# Patient Record
Sex: Female | Born: 1940 | Race: White | Hispanic: No | Smoking: Never smoker
Health system: Southern US, Community
[De-identification: ages and names within clinical notes are randomized; demographics above are authoritative.]

## PROBLEM LIST (undated history)

## (undated) DIAGNOSIS — I10 Essential (primary) hypertension: Secondary | ICD-10-CM

## (undated) DIAGNOSIS — N9489 Other specified conditions associated with female genital organs and menstrual cycle: Secondary | ICD-10-CM

## (undated) DIAGNOSIS — N8189 Other female genital prolapse: Secondary | ICD-10-CM

## (undated) DIAGNOSIS — H9190 Unspecified hearing loss, unspecified ear: Secondary | ICD-10-CM

## (undated) DIAGNOSIS — M1990 Unspecified osteoarthritis, unspecified site: Secondary | ICD-10-CM

## (undated) DIAGNOSIS — E039 Hypothyroidism, unspecified: Secondary | ICD-10-CM

## (undated) DIAGNOSIS — R102 Pelvic and perineal pain: Secondary | ICD-10-CM

## (undated) HISTORY — DX: Unspecified osteoarthritis, unspecified site: M19.90

## (undated) HISTORY — DX: Hypothyroidism, unspecified: E03.9

## (undated) HISTORY — DX: Other specified conditions associated with female genital organs and menstrual cycle: N94.89

## (undated) HISTORY — PX: ORTHOPEDIC SURGERY: SHX850

## (undated) HISTORY — DX: Unspecified hearing loss, unspecified ear: H91.90

## (undated) HISTORY — DX: Essential (primary) hypertension: I10

## (undated) HISTORY — DX: Other female genital prolapse: N81.89

## (undated) HISTORY — DX: Pelvic and perineal pain: R10.2

## (undated) HISTORY — PX: LEEP: SHX91

---

## 2005-06-19 ENCOUNTER — Ambulatory Visit: Payer: Self-pay

## 2006-06-25 ENCOUNTER — Ambulatory Visit: Payer: Self-pay

## 2007-06-29 ENCOUNTER — Ambulatory Visit: Payer: Self-pay | Admitting: Internal Medicine

## 2008-07-11 ENCOUNTER — Ambulatory Visit: Payer: Self-pay | Admitting: Internal Medicine

## 2009-07-31 ENCOUNTER — Ambulatory Visit: Payer: Self-pay | Admitting: Internal Medicine

## 2010-08-05 ENCOUNTER — Ambulatory Visit: Payer: Self-pay

## 2011-09-11 LAB — HM MAMMOGRAPHY

## 2011-09-11 LAB — HM PAP SMEAR: HM Pap smear: NEGATIVE

## 2011-10-05 ENCOUNTER — Ambulatory Visit: Payer: Self-pay

## 2014-05-06 DIAGNOSIS — E039 Hypothyroidism, unspecified: Secondary | ICD-10-CM | POA: Diagnosis present

## 2014-05-06 DIAGNOSIS — I129 Hypertensive chronic kidney disease with stage 1 through stage 4 chronic kidney disease, or unspecified chronic kidney disease: Secondary | ICD-10-CM | POA: Diagnosis present

## 2014-05-06 DIAGNOSIS — M810 Age-related osteoporosis without current pathological fracture: Secondary | ICD-10-CM | POA: Diagnosis present

## 2017-10-06 ENCOUNTER — Encounter: Payer: Self-pay | Admitting: Obstetrics and Gynecology

## 2017-10-06 ENCOUNTER — Other Ambulatory Visit: Payer: Self-pay | Admitting: Obstetrics and Gynecology

## 2017-10-06 ENCOUNTER — Encounter: Payer: BLUE CROSS/BLUE SHIELD | Admitting: Obstetrics and Gynecology

## 2017-10-06 MED ORDER — TRIAMCINOLONE ACETONIDE 0.025 % EX OINT: 1.0000 "application " | TOPICAL_OINTMENT | Freq: Two times a day (BID) | CUTANEOUS | 0 refills | Status: DC

## 2017-10-06 NOTE — Progress Notes (Signed)
This encounter was created in error - please disregard.

## 2017-10-06 NOTE — Progress Notes (Signed)
Pt with mild rectal itching and treating with OTC hydrocortisone crm with temporary relief. Tissue is mildy hypopigmented. Question LS vs chronic irritation. Rx triamcinolone crm to use sparingly for up to 2 wks. F/u if no sx relief.

## 2018-08-05 ENCOUNTER — Other Ambulatory Visit: Payer: Self-pay | Admitting: Internal Medicine

## 2018-08-05 DIAGNOSIS — Z1231 Encounter for screening mammogram for malignant neoplasm of breast: Secondary | ICD-10-CM

## 2018-10-26 ENCOUNTER — Encounter (HOSPITAL_COMMUNITY): Payer: Self-pay

## 2018-10-26 ENCOUNTER — Ambulatory Visit
Admission: RE | Admit: 2018-10-26 | Discharge: 2018-10-26 | Disposition: A | Discharge status: Home / Self Care | Payer: Medicare Other | Source: Ambulatory Visit | Attending: Internal Medicine | Admitting: Internal Medicine

## 2018-10-26 DIAGNOSIS — Z1231 Encounter for screening mammogram for malignant neoplasm of breast: Secondary | ICD-10-CM | POA: Diagnosis present

## 2018-11-11 ENCOUNTER — Encounter: Payer: Self-pay | Admitting: Maternal Newborn

## 2018-11-11 ENCOUNTER — Ambulatory Visit (INDEPENDENT_AMBULATORY_CARE_PROVIDER_SITE_OTHER): Payer: Medicare Other | Admitting: Maternal Newborn

## 2018-11-11 VITALS — BP 144/60 | HR 87 | Ht 64.0 in | Wt 158.0 lb

## 2018-11-11 DIAGNOSIS — R309 Painful micturition, unspecified: Secondary | ICD-10-CM

## 2018-11-11 DIAGNOSIS — R102 Pelvic and perineal pain: Secondary | ICD-10-CM | POA: Diagnosis not present

## 2018-11-11 LAB — POCT URINALYSIS DIPSTICK
Bilirubin, UA: NEGATIVE
Blood, UA: NEGATIVE
Glucose, UA: NEGATIVE
Leukocytes, UA: NEGATIVE
Nitrite, UA: NEGATIVE
Protein, UA: POSITIVE — AB
Spec Grav, UA: 1.015 (ref 1.010–1.025)
Urobilinogen, UA: 0.2 E.U./dL
pH, UA: 6 (ref 5.0–8.0)

## 2018-11-11 MED ORDER — TRIAMCINOLONE ACETONIDE 0.025 % EX OINT: 1.0000 "application " | TOPICAL_OINTMENT | Freq: Two times a day (BID) | CUTANEOUS | 0 refills | Status: DC

## 2018-11-11 NOTE — Progress Notes (Signed)
Urine cu   Obstetrics & Gynecology Office Visit   Chief Complaint:  Chief Complaint  Patient presents with  . Urinary Tract Infection    painful urinating, frequent urination, and difficulty holding urine, no blood noticed x 3-4 days    History of Present Illness: Krystal Sanchez started noticing some burning in her perineal area after voiding about three or four days ago. She has urinary frequency and occasional urgency but does not think it is really increased from her baseline. She does not have pain during urination. She had a UTI in August. She was also prescribed some cream for a rash the last time she was here in the office and is wondering if this might help with her problem this time. She has not noticed any vaginal discharge. She denies vaginal and vulvar itching.  Review of Systems: Review of systems negative unless otherwise noted in HPI  Past Medical History:  Past Medical History:  Diagnosis Date  . Hard of hearing   . Hypertension   . Hypothyroidism   . Pelvic pressure in female   . Pelvic relaxation     Past Surgical History:  Past Surgical History:  Procedure Laterality Date  . LEEP    . ORTHOPEDIC SURGERY      Gynecologic History: No LMP recorded. Patient is postmenopausal.  Obstetric History: [id number]  Family History:  Family History  Problem Relation Age of Onset  . Parkinson's disease Father   . Breast cancer Other     Social History:  Social History   Socioeconomic History  . Marital status: Widowed    Spouse name: Not on file  . Number of children: Not on file  . Years of education: Not on file  . Highest education level: Not on file  Occupational History  . Not on file  Social Needs  . Financial resource strain: Not on file  . Food insecurity:    Worry: Not on file    Inability: Not on file  . Transportation needs:    Medical: Not on file    Non-medical: Not on file  Tobacco Use  . Smoking status: Never Smoker  . Smokeless tobacco: Never  Used  Substance and Sexual Activity  . Alcohol use: No    Frequency: Never  . Drug use: No  . Sexual activity: Not on file  Lifestyle  . Physical activity:    Days per week: Not on file    Minutes per session: Not on file  . Stress: Not on file  Relationships  . Social connections:    Talks on phone: Not on file    Gets together: Not on file    Attends religious service: Not on file    Active member of club or organization: Not on file    Attends meetings of clubs or organizations: Not on file    Relationship status: Not on file  . Intimate partner violence:    Fear of current or ex partner: Not on file    Emotionally abused: Not on file    Physically abused: Not on file    Forced sexual activity: Not on file  Other Topics Concern  . Not on file  Social History Narrative  . Not on file    Allergies:  No Known Allergies  Medications: Prior to Admission medications   Medication Sig Start Date End Date Taking? Authorizing Provider  aspirin EC 81 MG tablet Take by mouth.    [provider]  atorvastatin (LIPITOR) 40 MG  tablet Take 40 mg by mouth daily. 08/30/17   [provider]  levothyroxine (SYNTHROID, LEVOTHROID) 50 MCG tablet TAKE 2 TABS BY MOUTH ONCE DAILY ON EMPTY STOMACH WITH GLASS OF WATER 30-60 MINS BEFORE BREAKFAST. 09/30/17   [provider]  levothyroxine (SYNTHROID, LEVOTHROID) 50 MCG tablet Take by mouth. 09/30/17   [provider]  LORazepam (ATIVAN) 0.5 MG tablet TAKE 1 TABLET BY MOUTH EVERY 8 HOURS AS NEEDED FOR ANXIETY 06/08/16   [provider]  losartan-hydrochlorothiazide (HYZAAR) 100-12.5 MG tablet Take 1 tablet by mouth daily. 09/18/17   [provider]  losartan-hydrochlorothiazide Mauri Reading) 100-12.5 MG tablet Take by mouth. 06/18/17   [provider]  omeprazole (PRILOSEC) 20 MG capsule Take by mouth.    [provider]  triamcinolone (KENALOG) 0.025 % ointment Apply 1 application  topically 2 (two) times daily. Prn sx up to 2 wks 10/06/17   Copland, Alicia B, PA-C  Vitamin D, Ergocalciferol, (DRISDOL) 50000 units CAPS capsule TAKE 1 CAPSULE BY MOUTH ONCE A WEEK AS DIRECTED 09/18/17   [provider]  Vitamin D, Ergocalciferol, (DRISDOL) 50000 units CAPS capsule TAKE 1 CAPSULE BY MOUTH ONCE A WEEK AS DIRECTED 06/18/17   [provider]    Physical Exam Vitals:  Vitals:   11/11/18 1023  BP: (!) 144/60  Pulse: 87   No LMP recorded. Patient is postmenopausal.  General: NAD HEENT: normocephalic, anicteric Pulmonary: No increased work of breathing Back: No CVAT Neurologic: Grossly intact Psychiatric: mood appropriate, affect full  Assessment: 78 y.o. [id number] with possible UTI versus perineal irritation.  Plan: Problem List Items Addressed This Visit    None    Visit Diagnoses    Painful urination    -  Primary   Relevant Orders   POCT Urinalysis Dipstick (Completed)   Urine Culture (Completed)   Perineal pain       Relevant Medications   triamcinolone (KENALOG) 0.025 % ointment     1) UA negative, will send culture to rule out UTI.  2) Sent refill for prescription cream to see if it helps with her symptoms of pain in the perineal area after voiding.  Marcelyn Bruins, CNM 11/11/2018

## 2018-11-13 LAB — URINE CULTURE

## 2018-11-14 ENCOUNTER — Encounter: Payer: Self-pay | Admitting: Maternal Newborn

## 2019-12-19 ENCOUNTER — Other Ambulatory Visit: Payer: Self-pay | Admitting: Internal Medicine

## 2019-12-19 DIAGNOSIS — N189 Chronic kidney disease, unspecified: Secondary | ICD-10-CM

## 2019-12-22 ENCOUNTER — Ambulatory Visit
Admission: RE | Admit: 2019-12-22 | Discharge: 2019-12-22 | Disposition: A | Discharge status: Home / Self Care | Payer: Medicare Other | Source: Ambulatory Visit | Attending: Internal Medicine | Admitting: Internal Medicine

## 2019-12-22 ENCOUNTER — Other Ambulatory Visit: Payer: Self-pay

## 2019-12-22 DIAGNOSIS — N189 Chronic kidney disease, unspecified: Secondary | ICD-10-CM | POA: Insufficient documentation

## 2020-01-02 ENCOUNTER — Other Ambulatory Visit: Payer: Self-pay | Admitting: Internal Medicine

## 2020-01-02 DIAGNOSIS — R1900 Intra-abdominal and pelvic swelling, mass and lump, unspecified site: Secondary | ICD-10-CM

## 2020-01-04 IMAGING — MG DIGITAL SCREENING BILATERAL MAMMOGRAM WITH TOMO AND CAD
8 series · 8 of 24 positions shown · non-contrast
Comparison: Previous exam(s).

CLINICAL DATA: Screening.

EXAM:
DIGITAL SCREENING BILATERAL MAMMOGRAM WITH TOMO AND CAD

[L CC synth-2D]
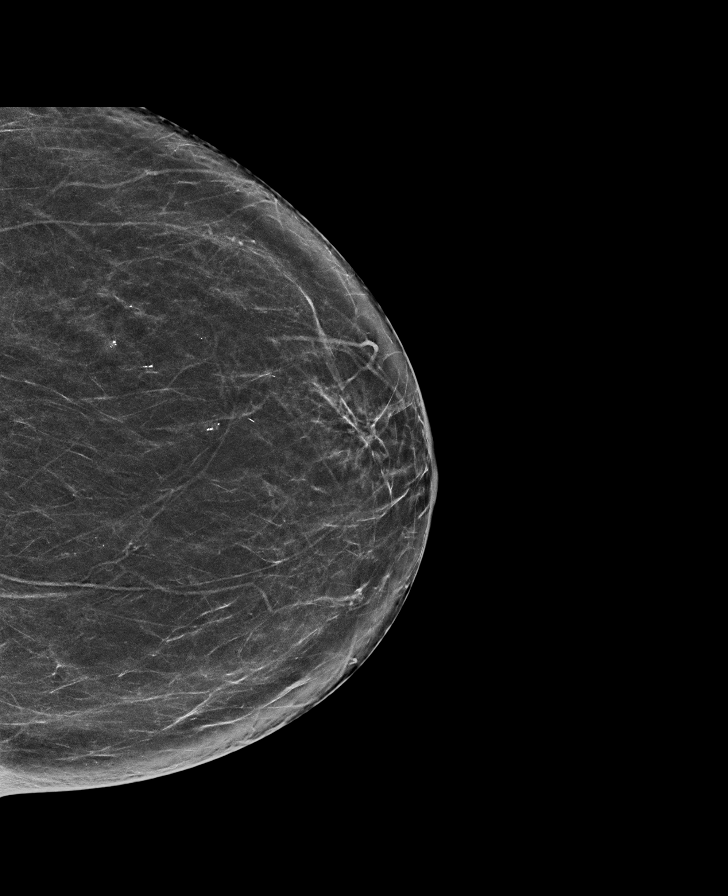

[L MLO synth-2D]
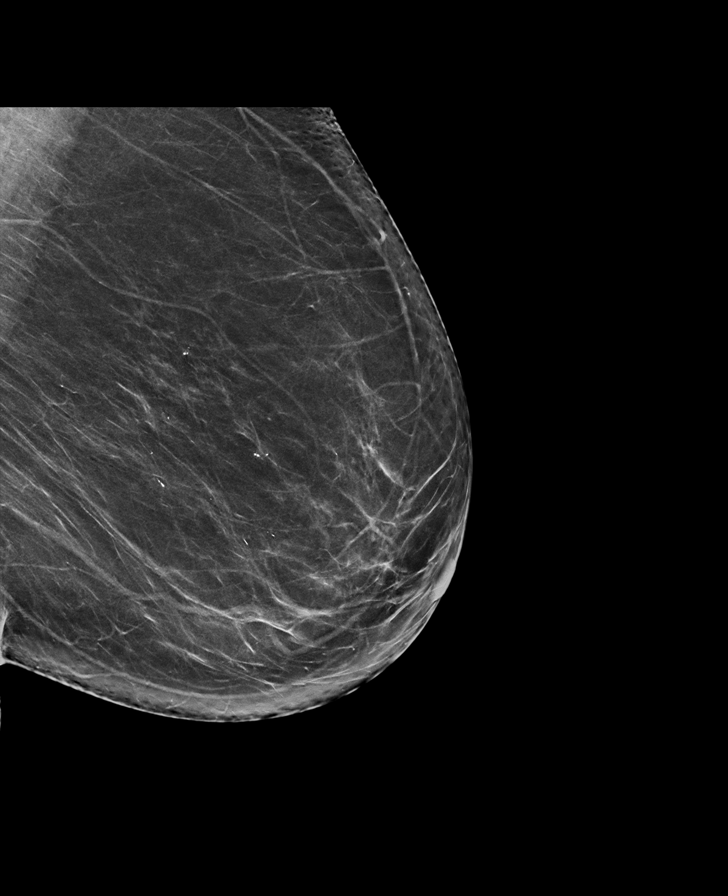

[R MLO synth-2D]
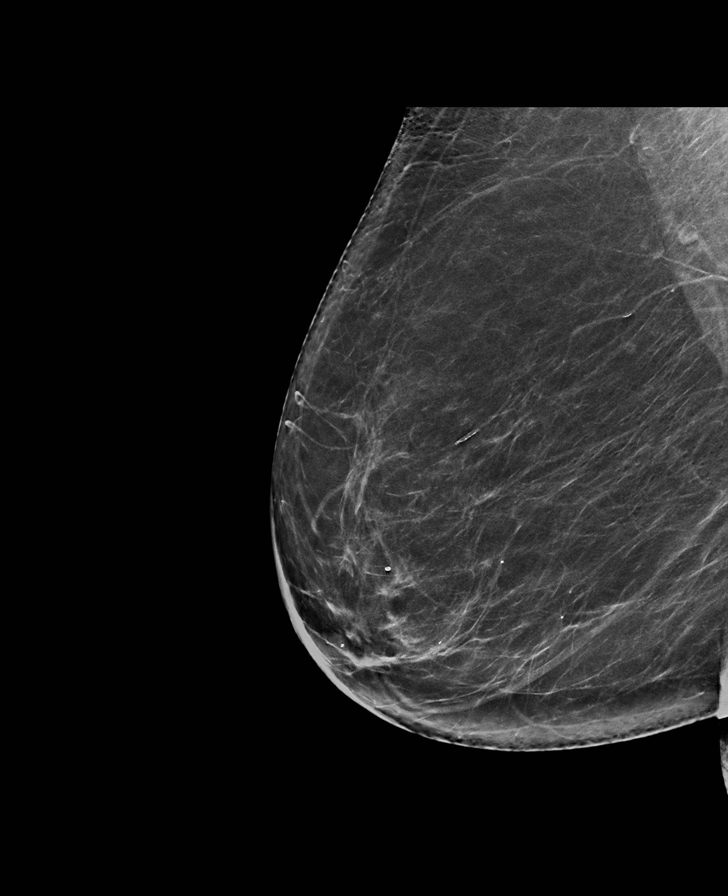

[R CC synth-2D]
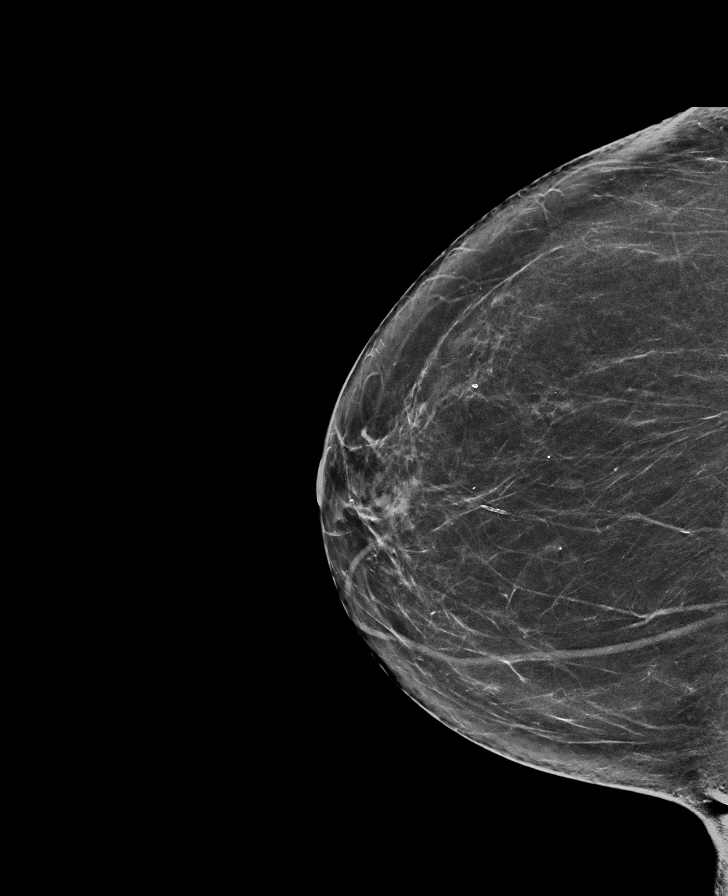

[L MLO tomo · tomo slice 39/78.0]
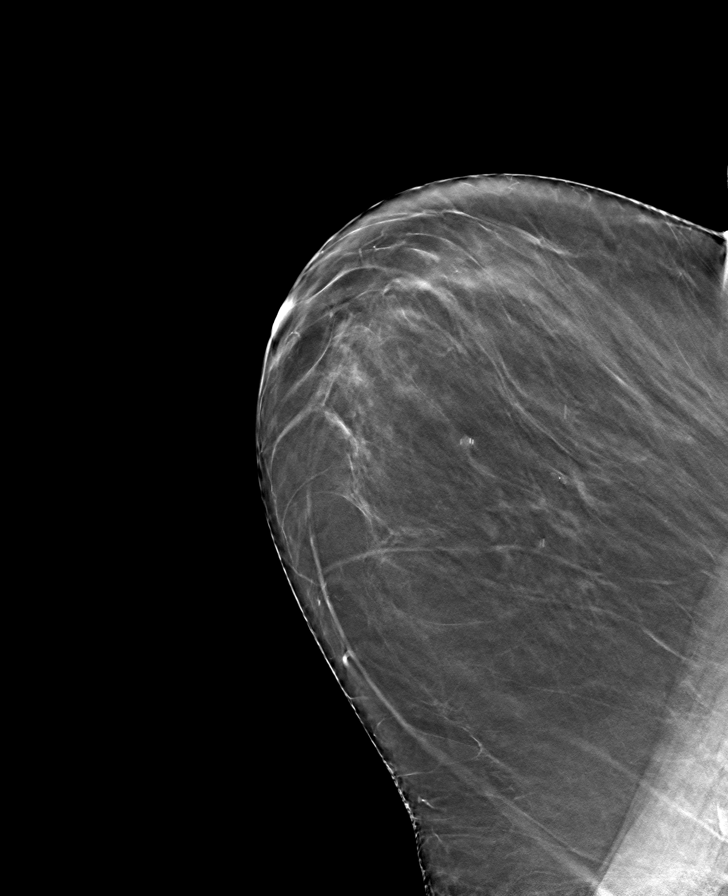

[R MLO tomo · tomo slice 40/79.0]
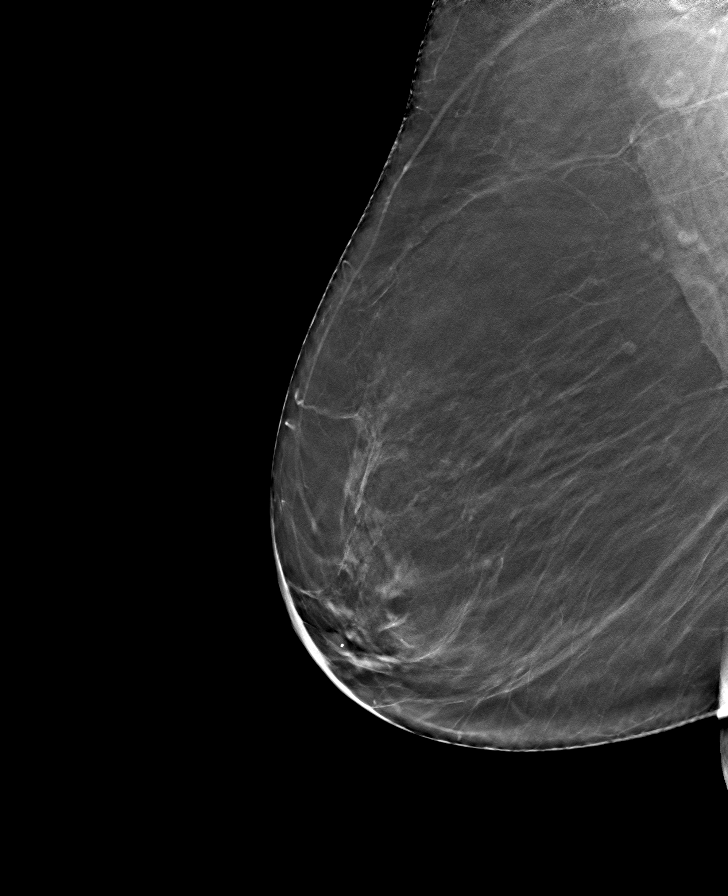

[R CC tomo · tomo slice 37/72.0]
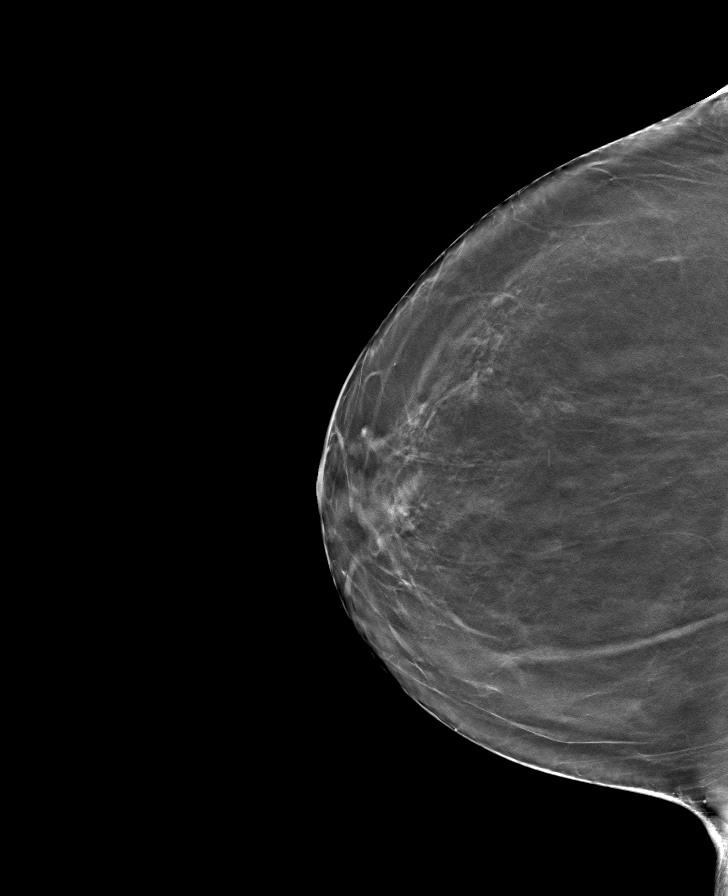

[L CC tomo · tomo slice 35/69.0]
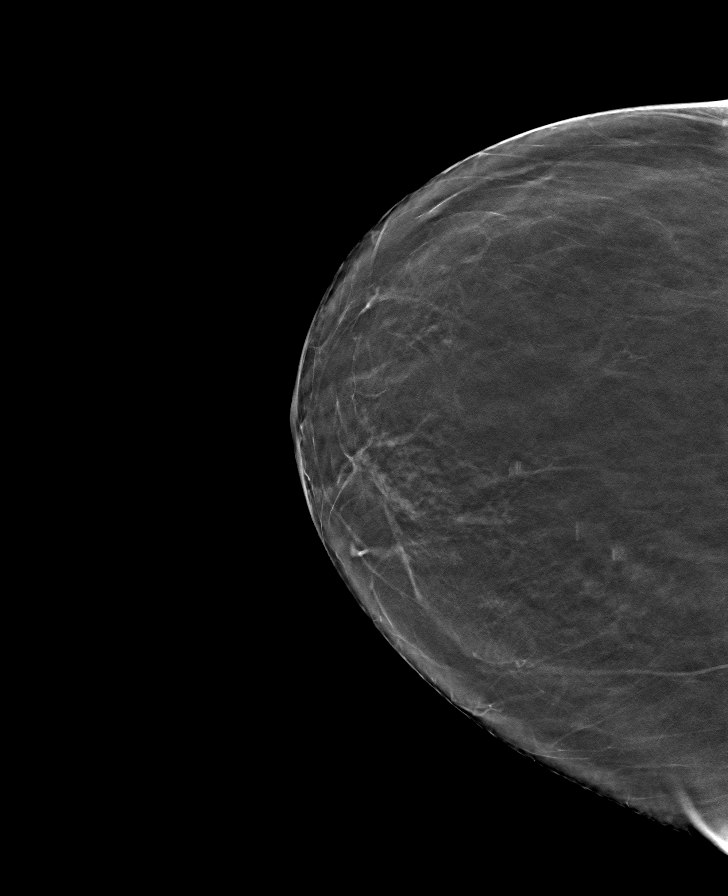

[8 of 24 positions shown; findings below may reference images not displayed]

ACR Breast Density Category b: There are scattered areas of
fibroglandular density.
FINDINGS: There are no findings suspicious for malignancy. Images were
processed with CAD.
IMPRESSION: No mammographic evidence of malignancy. A result letter of this
screening mammogram will be mailed directly to the patient.

RECOMMENDATION:
Screening mammogram in one year. (Code:[id number])

BI-RADS CATEGORY  1: Negative.

## 2020-01-05 ENCOUNTER — Ambulatory Visit
Admission: RE | Admit: 2020-01-05 | Discharge: 2020-01-05 | Disposition: A | Discharge status: Home / Self Care | Payer: Medicare Other | Source: Ambulatory Visit | Attending: Internal Medicine | Admitting: Internal Medicine

## 2020-01-05 ENCOUNTER — Other Ambulatory Visit: Payer: Self-pay

## 2020-01-05 DIAGNOSIS — R1900 Intra-abdominal and pelvic swelling, mass and lump, unspecified site: Secondary | ICD-10-CM | POA: Diagnosis present

## 2020-01-25 ENCOUNTER — Other Ambulatory Visit: Payer: Self-pay | Admitting: Internal Medicine

## 2020-01-25 DIAGNOSIS — R1900 Intra-abdominal and pelvic swelling, mass and lump, unspecified site: Secondary | ICD-10-CM

## 2020-02-01 ENCOUNTER — Other Ambulatory Visit: Payer: Self-pay

## 2020-02-01 DIAGNOSIS — N9489 Other specified conditions associated with female genital organs and menstrual cycle: Secondary | ICD-10-CM

## 2020-02-01 NOTE — Progress Notes (Signed)
Received inbox from Dr. Lucita Ferrara. Sonia Side to arrange consult for Ms. Santillo regarding adnexal mass. Spoke to Ms. Crain and appointment arranged for 4/21.

## 2020-02-07 ENCOUNTER — Encounter (INDEPENDENT_AMBULATORY_CARE_PROVIDER_SITE_OTHER): Payer: Self-pay

## 2020-02-07 ENCOUNTER — Inpatient Hospital Stay: Payer: Medicare Other | Attending: Obstetrics and Gynecology | Admitting: Obstetrics and Gynecology

## 2020-02-07 ENCOUNTER — Other Ambulatory Visit: Payer: Self-pay

## 2020-02-07 DIAGNOSIS — I129 Hypertensive chronic kidney disease with stage 1 through stage 4 chronic kidney disease, or unspecified chronic kidney disease: Secondary | ICD-10-CM | POA: Insufficient documentation

## 2020-02-07 DIAGNOSIS — E039 Hypothyroidism, unspecified: Secondary | ICD-10-CM | POA: Insufficient documentation

## 2020-02-07 DIAGNOSIS — H9190 Unspecified hearing loss, unspecified ear: Secondary | ICD-10-CM | POA: Insufficient documentation

## 2020-02-07 DIAGNOSIS — Z79899 Other long term (current) drug therapy: Secondary | ICD-10-CM | POA: Diagnosis not present

## 2020-02-07 DIAGNOSIS — R1900 Intra-abdominal and pelvic swelling, mass and lump, unspecified site: Secondary | ICD-10-CM | POA: Diagnosis present

## 2020-02-07 NOTE — Progress Notes (Signed)
Gynecologic Oncology Consult Visit   Referring Provider: Dr Leafy Ro  Chief Concern: pelvic mass  Subjective:  Krystal Sanchez is a 79 y.o. female who is seen in consultation from Dr. Leafy Ro for left ovarian 7.9 cm cystic mass.  Patient underwent an Renal US for her chronic kindey diease and a cystic mass in left adnexa was found; patient then underwent a pelvic ultrasound for further evaluation and was referred to [place] for consultation.  Renal US for CKD on 12/19/2019: There is an indeterminate 7.9 cm cystic appearing structure adjacent to the urinary bladder. This could represent a large bladder diverticulum or cystic ovarian mass. Follow-up with a contrast-enhanced CT or pelvic ultrasound is recommended for further evaluation of this finding.  TA/TVUS 01/05/2020: Ut: 6.1 x 2.5 x 4.2 cm = volume: 33 mL. Anteverted. Slightly heterogeneous myometrium. No focal mass. Small nabothian cysts at cervix. Endometrium Thickness: 2 mm. Small cyst within the endometrium at the upper uterine segment, 7 x 4 x 8 mm. Additional tiny endometrial cyst 59mm. No additional masses. RO: Not visualized, likely obscured by bowel LO: no normal appearing LEFT ovary visualized, see below Other findings No free pelvic fluid.  Cystic mass identified in the LEFT adnexa, 7.8 x 5.2 x 5.9 cm in overall size. Lesion contains a 2 mm thick septation and a few scattered internal echoes. No definite mural nodularity. No blood flow within the septation on color Doppler imaging. No mural nodularity. Impression: Complicated cystic lesion of the LEFT adnexa 7.8 cm in greatest size complicated by a single septation; since these may be difficult to assess completely with [place], further evaluation of simple-appearing and mildly complicated cysts >7 cm with MRI or surgical evaluation is recommended, according to the Society of Radiologists in Ultrasound 2010 Consensus Conference Statement (D Clovis Riley et al. Management of Asymptomatic Ovarian and  other Adnexal Cysts Imaged at [place]: Society of Radiologists in [place] Statement 2010. Radiology 256 (Sept 2010): 347-425.).   01/17/20 CA125=6 and HE=96 (normal)   She is hard of hearing and is accompanied by her daughter.  The patient lives alone with daughter 3 miles away.  Patient denies any pain or other symptoms that could be related to the ovarian cyst.  Problem List: Patient Active Problem List   Diagnosis Date Noted  . Pelvic mass in female 02/07/2020    Past Medical History: Past Medical History:  Diagnosis Date  . Hard of hearing   . Hypertension   . Hypothyroidism   . Pelvic pressure in female   . Pelvic relaxation     Past Surgical History: Past Surgical History:  Procedure Laterality Date  . LEEP    . ORTHOPEDIC SURGERY      OB History:  OB History  Gravida Para Term Preterm AB Living  2 2 2     2   SAB TAB Ectopic Multiple Live Births               # Outcome Date GA Lbr Len/2nd Weight Sex Delivery Anes PTL Lv  2 Term           1 Term             Family History: Family History  Problem Relation Age of Onset  . Parkinson's disease Father   . Breast cancer Other     Social History: Social History   Socioeconomic History  . Marital status: Widowed    Spouse name: Not on file  . Number of children: Not on file  .  Years of education: Not on file  . Highest education level: Not on file  Occupational History  . Not on file  Tobacco Use  . Smoking status: Never Smoker  . Smokeless tobacco: Never Used  Substance and Sexual Activity  . Alcohol use: No  . Drug use: No  . Sexual activity: Not on file  Other Topics Concern  . Not on file  Social History Narrative  . Not on file   Social Determinants of Health   Financial Resource Strain:   . Difficulty of Paying Living Expenses:   Food Insecurity:   . Worried About Programme researcher, broadcasting/film/video in the Last Year:   . Barista in the Last Year:   Transportation Needs:   .  Freight forwarder (Medical):   Marland Kitchen Lack of Transportation (Non-Medical):   Physical Activity:   . Days of Exercise per Week:   . Minutes of Exercise per Session:   Stress:   . Feeling of Stress :   Social Connections:   . Frequency of Communication with Friends and Family:   . Frequency of Social Gatherings with Friends and Family:   . Attends Religious Services:   . Active Member of Clubs or Organizations:   . Attends Banker Meetings:   Marland Kitchen Marital Status:   Intimate Partner Violence:   . Fear of Current or Ex-Partner:   . Emotionally Abused:   Marland Kitchen Physically Abused:   . Sexually Abused:     Allergies: No Known Allergies  Current Medications: Current Outpatient Medications  Medication Sig Dispense Refill  . atorvastatin (LIPITOR) 40 MG tablet Take 40 mg by mouth daily.  3  . losartan-hydrochlorothiazide (HYZAAR) 100-12.5 MG tablet Take 1 tablet by mouth daily.  1  . omeprazole (PRILOSEC) 20 MG capsule Take by mouth.    . Vitamin D, Ergocalciferol, (DRISDOL) 50000 units CAPS capsule TAKE 1 CAPSULE BY MOUTH ONCE A WEEK AS DIRECTED  1  . Vitamin D, Ergocalciferol, (DRISDOL) 50000 units CAPS capsule TAKE 1 CAPSULE BY MOUTH ONCE A WEEK AS DIRECTED     No current facility-administered medications for this visit.    Review of Systems General: negative for, fevers, chills, fatigue, changes in sleep, changes in weight or appetite Skin: negative for changes in color, texture, moles or lesions Eyes: negative for, changes in vision, pain, diplopia HEENT: negative for, pain, discharge, tinnitus, vertigo, voice changes, sore throat, neck masses Pulmonary: negative for, dyspnea, orthopnea, productive cough Cardiac: negative for, palpitations, syncope, pain, discomfort, pressure Gastrointestinal: negative for, dysphagia, nausea, vomiting, jaundice, pain, constipation, diarrhea, hematemesis, hematochezia Genitourinary/Sexual: negative for, dysuria, discharge, hesitancy,  nocturia, retention, stones, infections, STD's, incontinence Musculoskeletal: negative for, pain, stiffness, swelling, range of motion limitation Hematology: negative for, easy bruising, bleeding Neurologic/Psych: negative for, headaches, seizures, paralysis, weakness, tremor, change in gait, change in sensation, mood swings, depression, anxiety, change in memory  Objective:  Physical Examination:  There were no vitals taken for this visit.   ECOG Performance Status: 0 - Asymptomatic  General appearance: alert, cooperative and appears stated age HEENT:PERRLA, sclera clear, anicteric, thyroid without masses and trachea midline Lymph node survey: non-palpable, axillary, inguinal, supraclavicular Cardiovascular: regular rate and rhythm, no murmurs or gallops Respiratory: normal air entry, lungs clear to auscultation and no rales, rhonchi or wheezing Abdomen: soft, non-tender, without masses or organomegaly, no hernias and well healed incision Back: inspection of back is normal Extremities: extremities normal, atraumatic, no cyanosis or edema Skin exam - normal coloration and  turgor, no rashes, no suspicious skin lesions noted. Neurological exam reveals alert, oriented, normal speech, no focal findings or movement disorder noted.  Pelvic: exam chaperoned by nurse;  Vulva: normal appearing vulva with no masses, tenderness or lesions; Vagina: normal vagina; Adnexa: normal adnexa in size, nontender and no masses; Uterus: uterus is normal size, shape, consistency and nontender; Cervix: anteverted; Rectal: confirms no masses.  Assessment:  JUSTISS GERBINO is a 79 y.o. female diagnosed with incidentally found 7.9 cm left ovarian cystic mass with one septation. No prior imaging for comparison. Asymptomatic and normal tumor markers.  Medical co-morbidities complicating care:hard of hearing.  Plan:   Problem List Items Addressed This Visit      Other   Pelvic mass in female     We discussed  options for management including surgery versus expectant management.  Since she is asymptomatic and tumor markers are normal we advise expectant management and they are in agreement.  MRI was recommended by Radiology because mass larger than 7 cm, but [place] characteristics are benign and this is scheduled.  Assuming this is normal she can follow up with Dr Dalbert Garnet in 3 months for follow up US to confirm stability.  The patient's diagnosis, an outline of the further diagnostic and laboratory studies which will be required, the recommendation, and alternatives were discussed.  All questions were answered to the patient's satisfaction.  A total of 40 minutes were spent with the patient/family today; 60 % was spent in education, counseling and coordination of care for cystic ovarian mass.    Krystal Lauth, MD  CC:  Lauro Regulus, MD [address] [facility] [place],  [place] [address] [phone]

## 2020-02-07 NOTE — Progress Notes (Signed)
Pt new pt today- has adnexal mass, pt has no symptoms- no bleeding, no pain. Sometimes at night she has frequency of urine- never incontinent.

## 2020-02-08 ENCOUNTER — Ambulatory Visit: Payer: Medicare Other

## 2020-02-08 ENCOUNTER — Encounter: Payer: Self-pay | Admitting: Obstetrics and Gynecology

## 2020-06-03 ENCOUNTER — Telehealth: Payer: Self-pay

## 2020-06-03 NOTE — Telephone Encounter (Signed)
Follow up on requested [place] and office visit with Dr. Dalbert Garnet that was recommended during her 01/2020 visit with Dr. Johnnette Litter. Message sent to her office regarding. Navigator signing off.

## 2020-09-03 ENCOUNTER — Other Ambulatory Visit: Payer: Self-pay | Admitting: Internal Medicine

## 2020-09-03 DIAGNOSIS — N83209 Unspecified ovarian cyst, unspecified side: Secondary | ICD-10-CM

## 2020-09-10 ENCOUNTER — Ambulatory Visit: Payer: Medicare Other

## 2020-09-16 ENCOUNTER — Other Ambulatory Visit: Payer: Self-pay

## 2020-09-16 ENCOUNTER — Ambulatory Visit
Admission: RE | Admit: 2020-09-16 | Discharge: 2020-09-16 | Disposition: A | Discharge status: Home / Self Care | Payer: Medicare Other | Source: Ambulatory Visit | Attending: Internal Medicine | Admitting: Internal Medicine

## 2020-09-16 DIAGNOSIS — N83209 Unspecified ovarian cyst, unspecified side: Secondary | ICD-10-CM | POA: Insufficient documentation

## 2021-03-01 IMAGING — US US RENAL
1 series · 14 of 25 positions shown · non-contrast
Comparison: None.

CLINICAL DATA: Chronic kidney disease

EXAM:
RENAL / URINARY TRACT ULTRASOUND COMPLETE

[Series 1: us renal · 0.23mm/px · 14 of 45 slices shown]
[im 1/45]
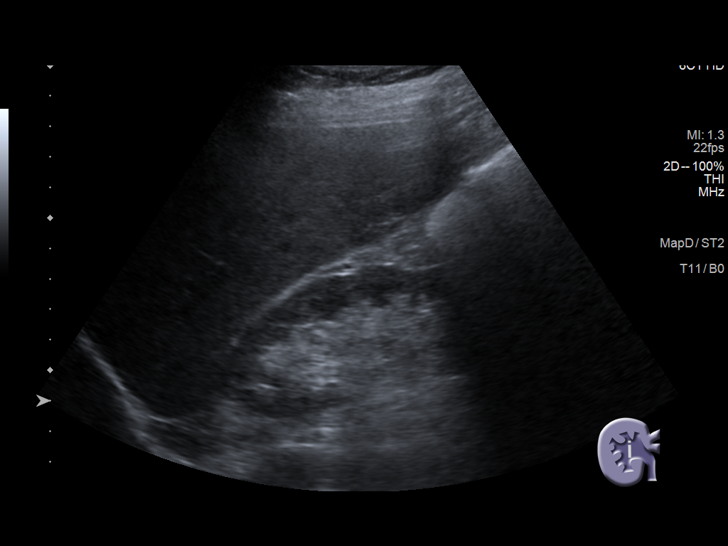
[im 4/45]
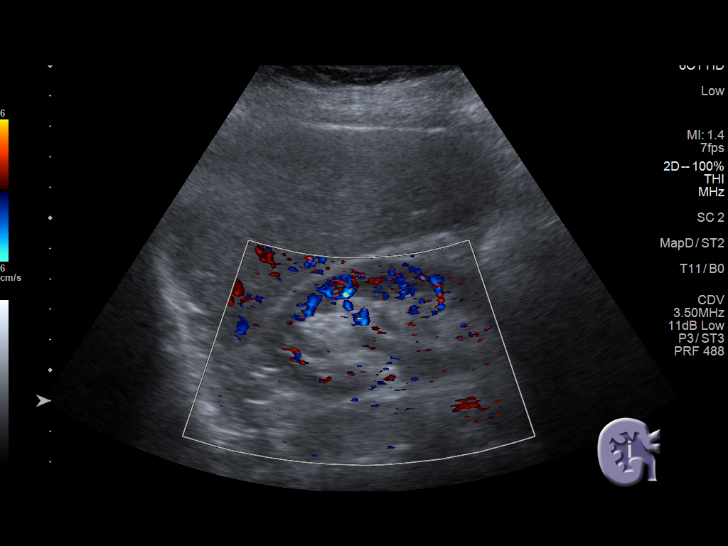
[im 8/45]
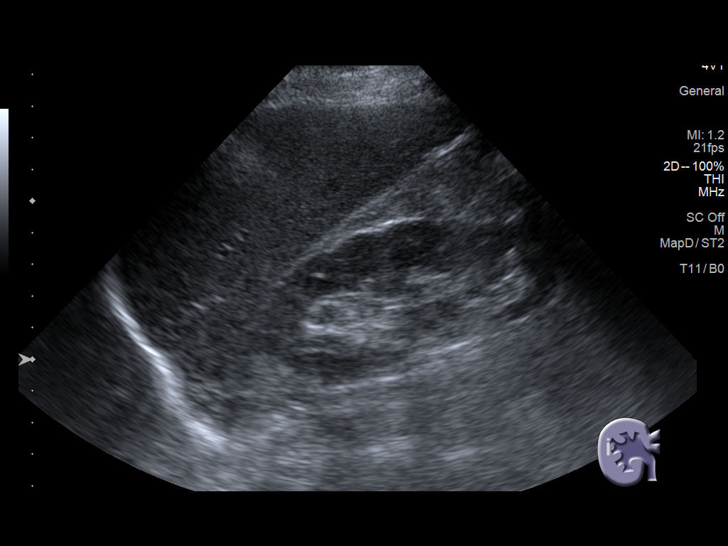
[im 12/45]
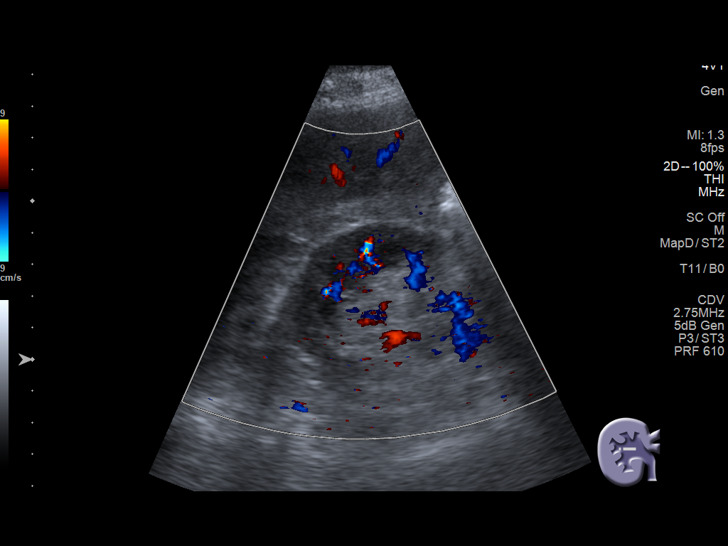
[im 15/45]
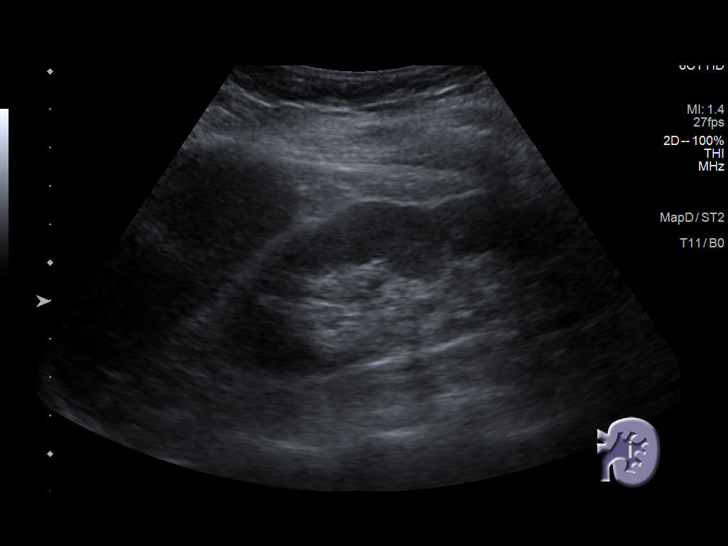
[im 17/45]
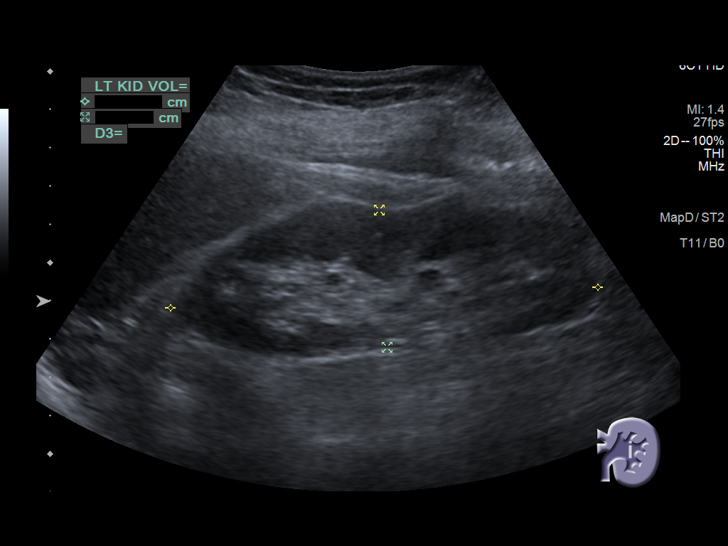
[im 21/45]
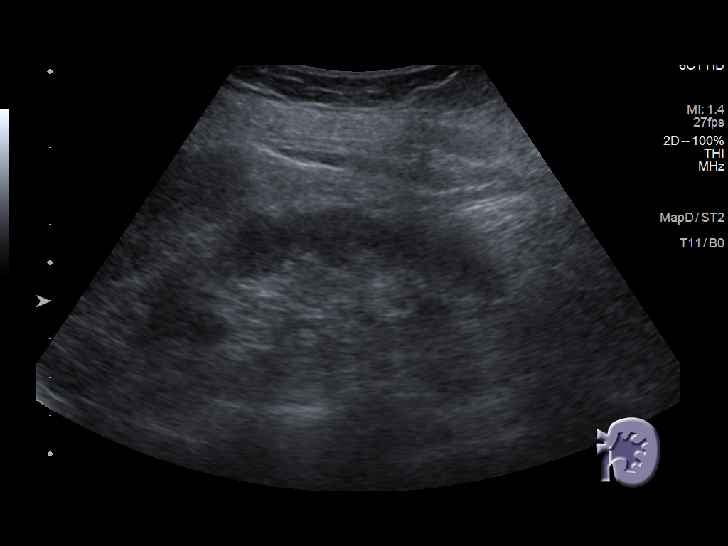
[im 24/45]
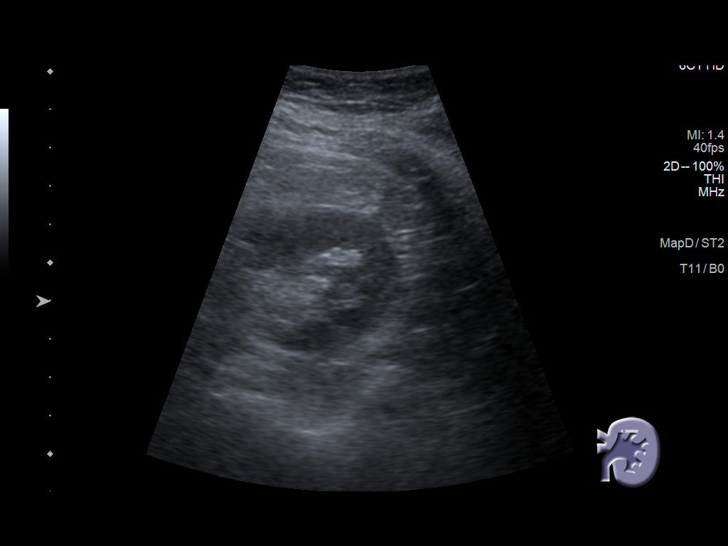
[im 28/45]
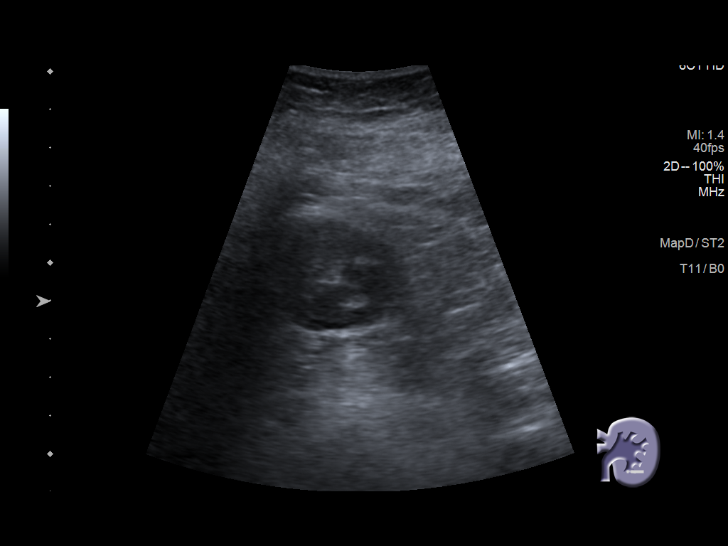
[im 30/45]
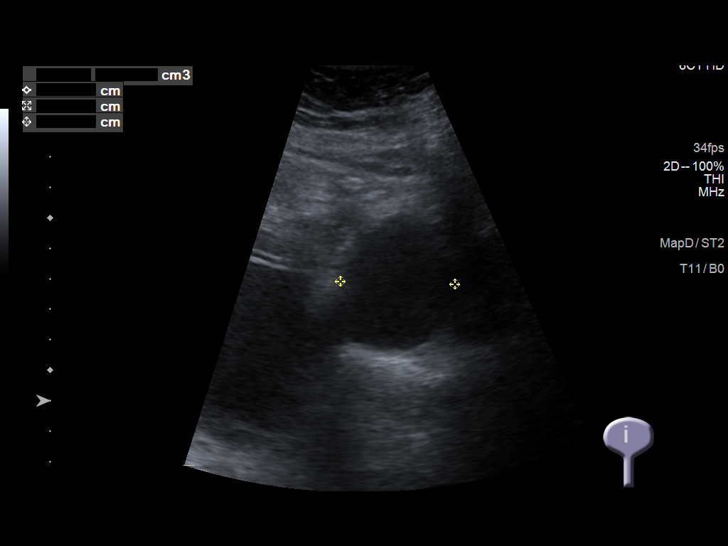
[im 34/45]
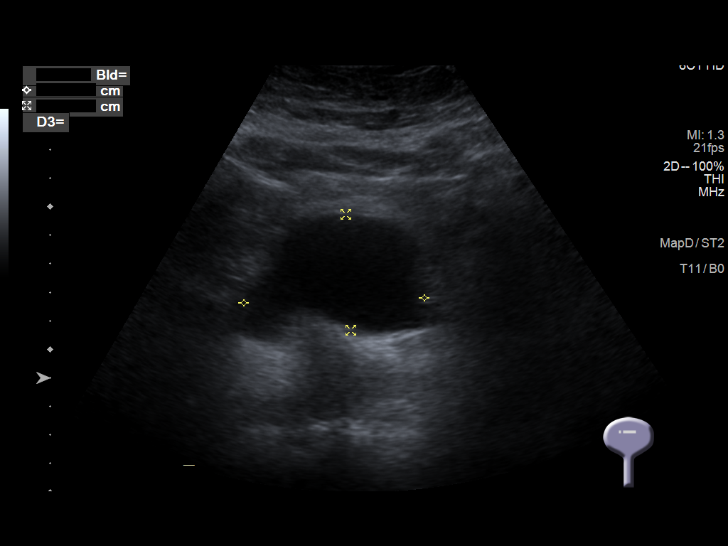
[im 37/45]
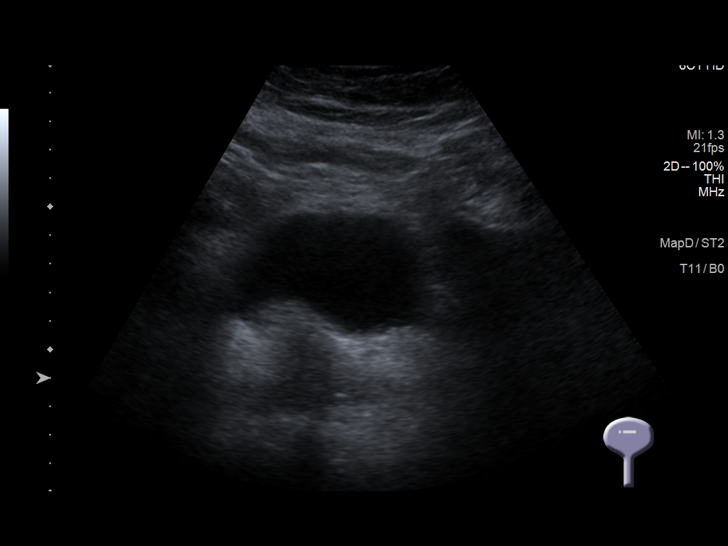
[im 41/45]
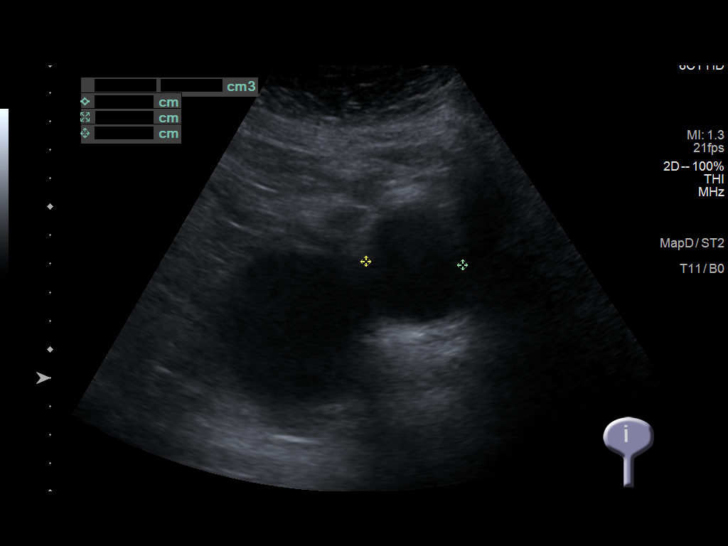
[im 45/45]
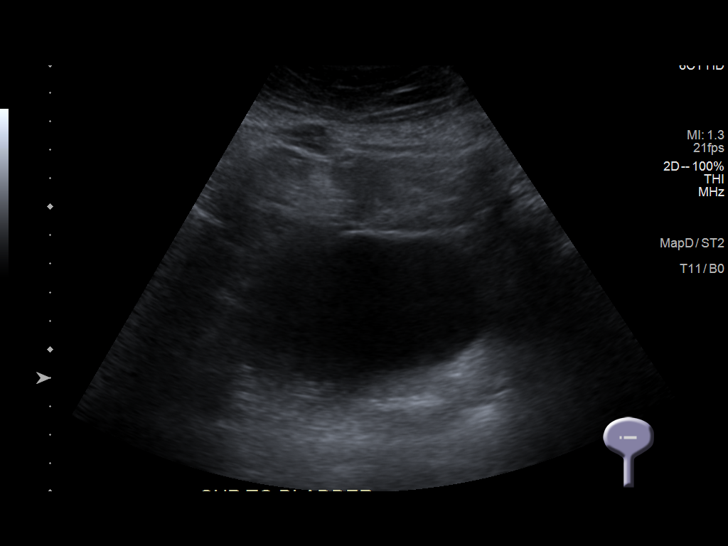

[14 of 25 positions shown; findings below may reference images not displayed]

FINDINGS: Right Kidney:

Renal measurements: 10 x 3.9 x 4.7 cm = volume: 95 mL . Echogenicity
within normal limits. No mass or hydronephrosis visualized.

Left Kidney:

Renal measurements: 11.2 x 3.6 x 4 cm = volume: 84 mL. Echogenicity
within normal limits. No mass or hydronephrosis visualized.

Bladder:

Appears normal for degree of bladder distention. Adjacent to the
bladder is a 5 x 7.9 x 4.8 cm structure.

Other:

None.
IMPRESSION: 1. No acute abnormality.  No evidence for hydronephrosis.
2. There is an indeterminate 7.9 cm cystic appearing structure
adjacent to the urinary bladder. This could represent a large
bladder diverticulum or cystic ovarian mass. Follow-up with a
contrast-enhanced CT or pelvic ultrasound is recommended for further
evaluation of this finding.

## 2021-11-25 IMAGING — US US PELVIS COMPLETE WITH TRANSVAGINAL
1 series · 13 of 25 positions shown · non-contrast
Comparison: 01/05/2020

CLINICAL DATA: Follow-up LEFT ovarian cyst; postmenopausal



[Series 1: us pelvic complete with transvaginal · 13 of 92 slices shown]
[im 1/92]
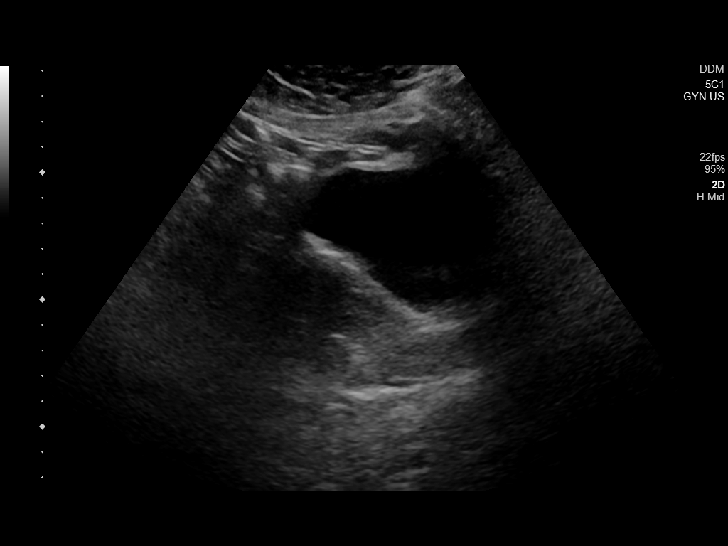
[im 8/92]
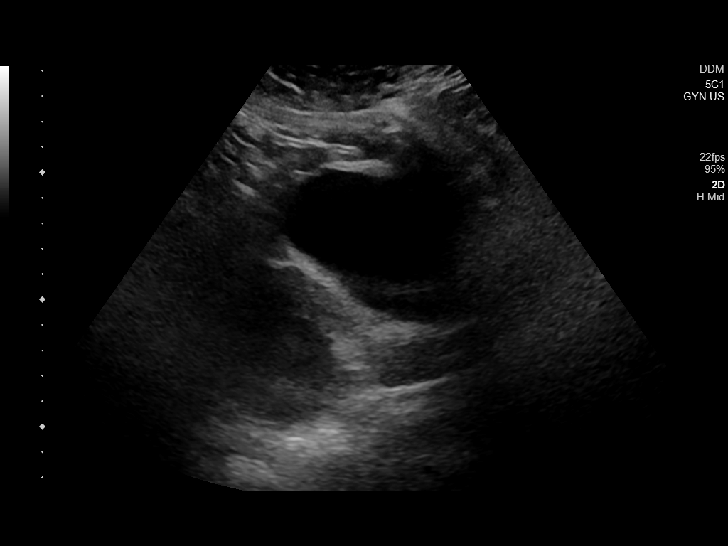
[im 16/92]
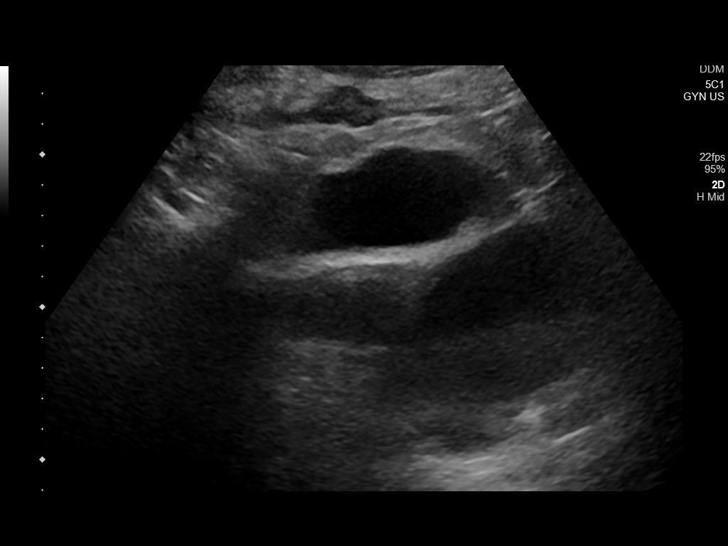
[im 23/92]
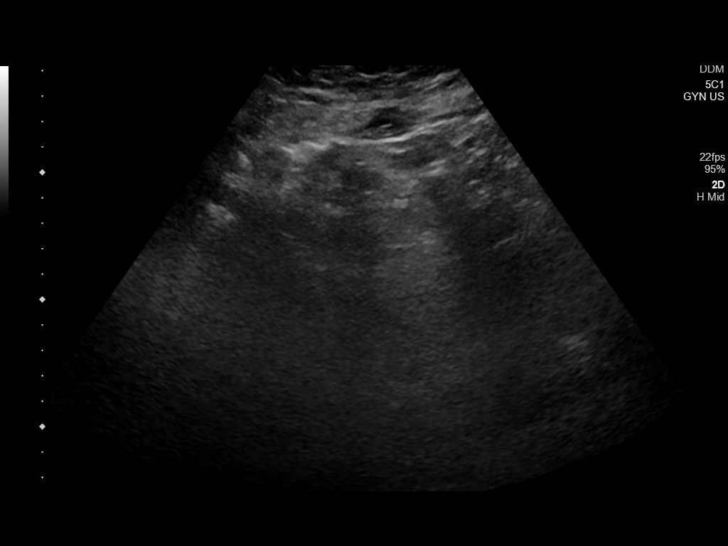
[im 31/92]
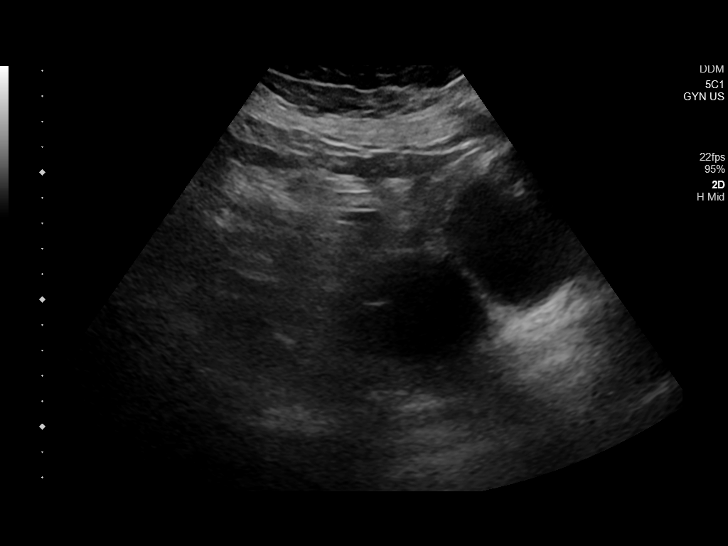
[im 38/92]
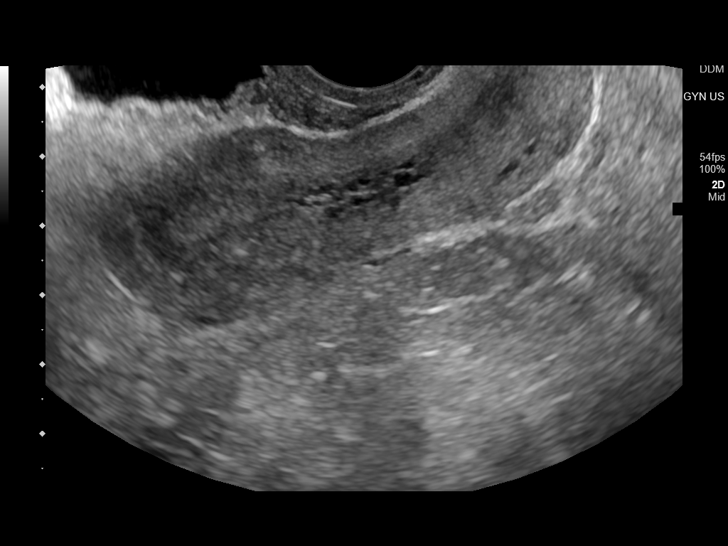
[im 46/92]
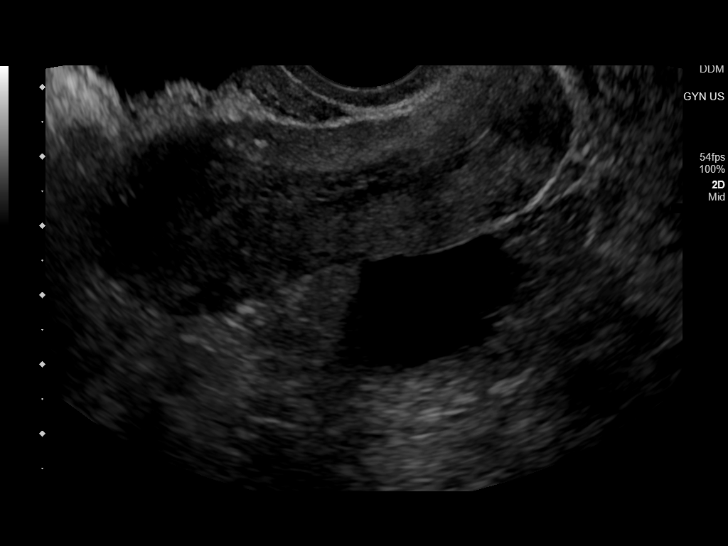
[im 54/92]
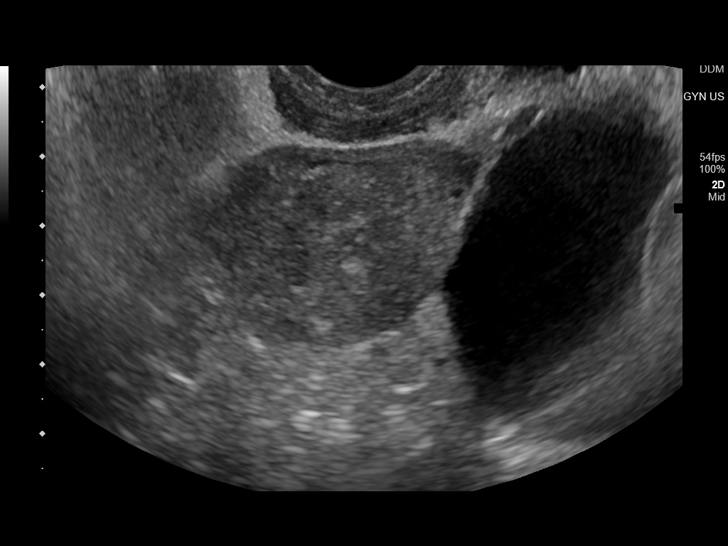
[im 61/92]
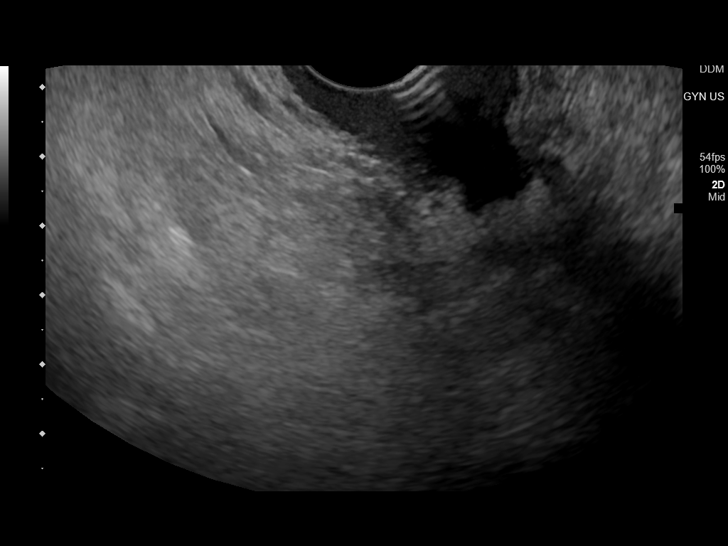
[im 69/92]
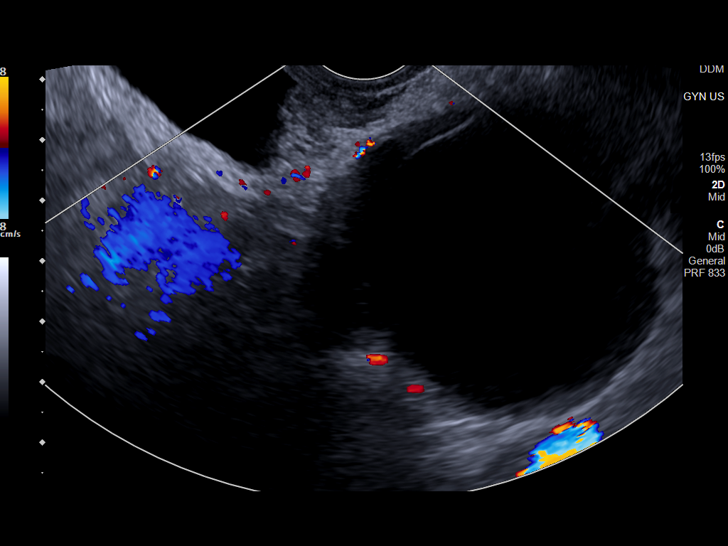
[im 76/92]
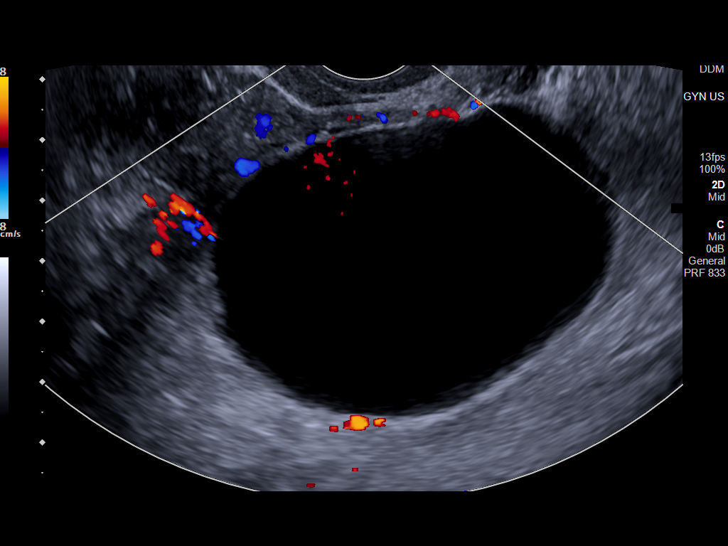
[im 84/92]
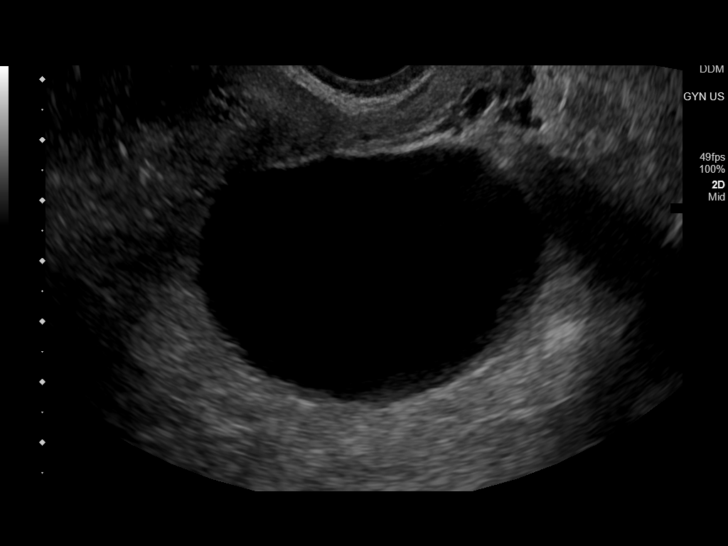
[im 92/92]
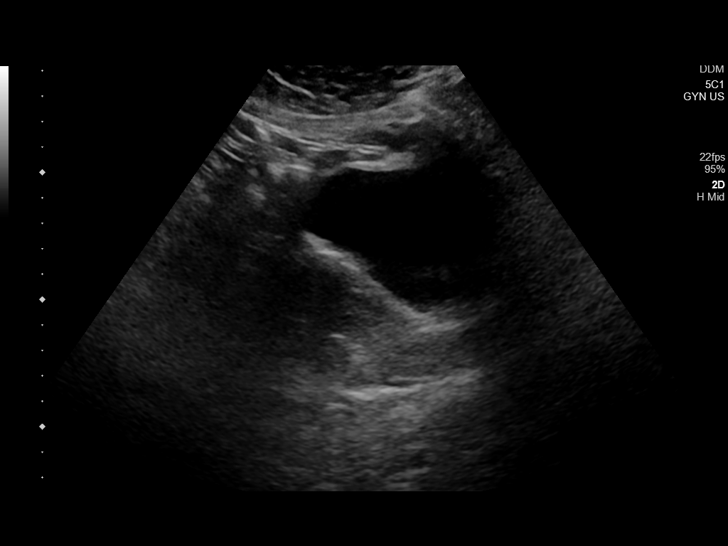

[13 of 25 positions shown; findings below may reference images not displayed]

FINDINGS: Uterus

Measurements: 7.6 x 2.6 x 3.9 cm = volume: 40 mL. Anteverted. Normal
morphology without mass

Endometrium

Thickness: 2 mm. Minimal cystic changes at the endometrium at the
lower uterine segment. No endometrial mass.

Right ovary

Not visualized, likely obscured by bowel

Left ovary

Measurements: 7.2 x 5.1 x 8.7 cm = volume: 168 mL. Large cystic mass
identified in LEFT adnexa likely of LEFT ovarian origin 7.5 x 4.8 x
7.1 cm (volume = 130 cm^3) previously 7.8 x 5.2 x 5.9 cm (volume
= 130 cm^3). Mass contains a single thin septation. No definite
mural nodularity.

Other findings

No free pelvic fluid.  No additional pelvic masses.
IMPRESSION: Persistent large complicated cyst of the LEFT ovary 7.5 cm in
greatest size containing a single thin septation, stable in volume
versus previous study.

Either further characterization by MR imaging or surgical evaluation
recommended.

This recommendation follows the consensus statement: Management of
Asymptomatic Ovarian and Other Adnexal Cysts Imaged at US: Society
of Radiologists in Ultrasound Consensus Conference Statement.

## 2022-02-24 ENCOUNTER — Encounter: Payer: Self-pay | Admitting: Ophthalmology

## 2022-02-26 NOTE — Discharge Instructions (Signed)

## 2022-03-03 ENCOUNTER — Other Ambulatory Visit: Payer: Self-pay

## 2022-03-03 ENCOUNTER — Ambulatory Visit: Payer: Medicare Other | Admitting: Anesthesiology

## 2022-03-03 ENCOUNTER — Ambulatory Visit
Admission: RE | Admit: 2022-03-03 | Discharge: 2022-03-03 | Disposition: A | Discharge status: Home / Self Care | Payer: Medicare Other | Attending: Ophthalmology | Admitting: Ophthalmology

## 2022-03-03 ENCOUNTER — Encounter
Admission: RE | Disposition: A | Discharge status: Home / Self Care | Payer: Self-pay | Source: Home / Self Care | Attending: Ophthalmology

## 2022-03-03 ENCOUNTER — Encounter: Payer: Self-pay | Admitting: Ophthalmology

## 2022-03-03 DIAGNOSIS — H2512 Age-related nuclear cataract, left eye: Secondary | ICD-10-CM | POA: Insufficient documentation

## 2022-03-03 DIAGNOSIS — Z79899 Other long term (current) drug therapy: Secondary | ICD-10-CM | POA: Diagnosis not present

## 2022-03-03 DIAGNOSIS — Z01810 Encounter for preprocedural cardiovascular examination: Secondary | ICD-10-CM | POA: Diagnosis not present

## 2022-03-03 DIAGNOSIS — Z79890 Hormone replacement therapy (postmenopausal): Secondary | ICD-10-CM | POA: Diagnosis not present

## 2022-03-03 DIAGNOSIS — I10 Essential (primary) hypertension: Secondary | ICD-10-CM | POA: Diagnosis not present

## 2022-03-03 DIAGNOSIS — E039 Hypothyroidism, unspecified: Secondary | ICD-10-CM | POA: Diagnosis not present

## 2022-03-03 DIAGNOSIS — M1990 Unspecified osteoarthritis, unspecified site: Secondary | ICD-10-CM | POA: Diagnosis not present

## 2022-03-03 HISTORY — PX: CATARACT EXTRACTION W/PHACO: SHX586

## 2022-03-03 SURGERY — PHACOEMULSIFICATION, CATARACT, WITH IOL INSERTION
Anesthesia: Monitor Anesthesia Care | Site: Eye | Laterality: Left

## 2022-03-03 MED ORDER — LIDOCAINE HCL (PF) 2 % IJ SOLN
INTRAMUSCULAR | Status: DC | PRN
  Administered 2022-03-03: 1 mL via INTRAMUSCULAR

## 2022-03-03 MED ORDER — FENTANYL CITRATE (PF) 100 MCG/2ML IJ SOLN
INTRAMUSCULAR | Status: DC | PRN
  Administered 2022-03-03: 25 ug via INTRAVENOUS

## 2022-03-03 MED ORDER — TETRACAINE HCL 0.5 % OP SOLN
1.0000 [drp] | OPHTHALMIC | Status: DC | PRN
  Administered 2022-03-03 (×3): 1 [drp] via OPHTHALMIC

## 2022-03-03 MED ORDER — EPINEPHRINE PF 1 MG/ML IJ SOLN
INTRAMUSCULAR | Status: DC | PRN
  Administered 2022-03-03: 75 mL via OPHTHALMIC

## 2022-03-03 MED ORDER — ACETAMINOPHEN 160 MG/5ML PO SOLN: 325.0000 mg | ORAL | Status: DC | PRN

## 2022-03-03 MED ORDER — PHENYLEPHRINE HCL 10 % OP SOLN
1.0000 [drp] | OPHTHALMIC | Status: AC | PRN
Start: 2022-03-03 — End: 2022-03-03
  Administered 2022-03-03 (×3): 1 [drp] via OPHTHALMIC

## 2022-03-03 MED ORDER — BRIMONIDINE TARTRATE-TIMOLOL 0.2-0.5 % OP SOLN
OPHTHALMIC | Status: DC | PRN
  Administered 2022-03-03: 1 [drp] via OPHTHALMIC

## 2022-03-03 MED ORDER — MOXIFLOXACIN HCL 0.5 % OP SOLN
OPHTHALMIC | Status: DC | PRN
  Administered 2022-03-03: 0.2 mL via OPHTHALMIC

## 2022-03-03 MED ORDER — ONDANSETRON HCL 4 MG/2ML IJ SOLN: 4.0000 mg | Freq: Once | INTRAMUSCULAR | Status: DC | PRN

## 2022-03-03 MED ORDER — MIDAZOLAM HCL 2 MG/2ML IJ SOLN
INTRAMUSCULAR | Status: DC | PRN
  Administered 2022-03-03 (×2): .5 mg via INTRAVENOUS

## 2022-03-03 MED ORDER — SIGHTPATH DOSE#1 NA CHONDROIT SULF-NA HYALURON 40-17 MG/ML IO SOLN
INTRAOCULAR | Status: DC | PRN
Start: 2022-03-03 — End: 2022-03-03
  Administered 2022-03-03: 1 mL via INTRAOCULAR

## 2022-03-03 MED ORDER — SIGHTPATH DOSE#1 BSS IO SOLN
INTRAOCULAR | Status: DC | PRN
  Administered 2022-03-03: 15 mL

## 2022-03-03 MED ORDER — CYCLOPENTOLATE HCL 2 % OP SOLN
1.0000 [drp] | OPHTHALMIC | Status: AC | PRN
  Administered 2022-03-03 (×3): 1 [drp] via OPHTHALMIC

## 2022-03-03 MED ORDER — ACETAMINOPHEN 325 MG PO TABS: 325.0000 mg | ORAL_TABLET | ORAL | Status: DC | PRN

## 2022-03-03 SURGICAL SUPPLY — 10 items
CATARACT SUITE SIGHTPATH (MISCELLANEOUS) ×2 IMPLANT
FEE CATARACT SUITE SIGHTPATH (MISCELLANEOUS) ×1 IMPLANT
GLOVE SURG ENC TEXT LTX SZ8 (GLOVE) ×2 IMPLANT
GLOVE SURG TRIUMPH 8.0 PF LTX (GLOVE) ×2 IMPLANT
LENS IOL TECNIS EYHANCE 23.5 (Intraocular Lens) ×1 IMPLANT
NDL FILTER BLUNT 18X1 1/2 (NEEDLE) ×1 IMPLANT
NEEDLE FILTER BLUNT 18X 1/2SAF (NEEDLE) ×1
NEEDLE FILTER BLUNT 18X1 1/2 (NEEDLE) ×1 IMPLANT
SYR 3ML LL SCALE MARK (SYRINGE) ×2 IMPLANT
WATER STERILE IRR 250ML POUR (IV SOLUTION) ×2 IMPLANT

## 2022-03-03 NOTE — Op Note (Signed)
PREOPERATIVE DIAGNOSIS:  Nuclear sclerotic cataract of the left eye. ?  ?POSTOPERATIVE DIAGNOSIS:  Nuclear sclerotic cataract of the left eye. ?  ?OPERATIVE PROCEDURE:ORPROCALL@ ?  ?SURGEON:  Galen Manila, MD. ?  ?ANESTHESIA: ? ?Anesthesiologist: Jola Babinski, MD ?CRNA: Michaele Offer, CRNA ? ?1.      Managed anesthesia care. ?2.     0.75ml of Shugarcaine was instilled following the paracentesis ?  ?COMPLICATIONS:  None. ?  ?TECHNIQUE:   Stop and chop ?  ?DESCRIPTION OF PROCEDURE:  The patient was examined and consented in the preoperative holding area where the aforementioned topical anesthesia was applied to the left eye and then brought back to the Operating Room where the left eye was prepped and draped in the usual sterile ophthalmic fashion and a lid speculum was placed. A paracentesis was created with the side port blade and the anterior chamber was filled with viscoelastic. A near clear corneal incision was performed with the steel keratome. A continuous curvilinear capsulorrhexis was performed with a cystotome followed by the capsulorrhexis forceps. Hydrodissection and hydrodelineation were carried out with BSS on a blunt cannula. The lens was removed in a stop and chop  technique and the remaining cortical material was removed with the irrigation-aspiration handpiece. The capsular bag was inflated with viscoelastic and the Technis [id number] lens was placed in the capsular bag without complication. The remaining viscoelastic was removed from the eye with the irrigation-aspiration handpiece. The wounds were hydrated. The anterior chamber was flushed with BSS and the eye was inflated to physiologic pressure. 0.68ml Vigamox was placed in the anterior chamber. The wounds were found to be water tight. The eye was dressed with Combigan. The patient was given protective glasses to wear throughout the day and a shield with which to sleep tonight. The patient was also given drops with which to begin a drop regimen  today and will follow-up with me in one day. ?Implant Name Type Inv. Item Serial No. Manufacturer Lot No. LRB No. Used Action  ?LENS IOL TECNIS EYHANCE 23.5 - [id number] Intraocular Lens LENS IOL TECNIS EYHANCE 23.5 [id number] [employer]  Left 1 Implanted  ?  ?Procedure(s) with comments: ?CATARACT EXTRACTION PHACO AND INTRAOCULAR LENS PLACEMENT (IOC) LEFT (Left) - 19.32 ?01:33.0 ? ?Electronically signed: Galen Manila 03/03/2022 11:37 AM ? ?

## 2022-03-03 NOTE — Anesthesia Preprocedure Evaluation (Signed)
Anesthesia Evaluation  ?Patient identified by MRN, date of birth, ID band ?Patient awake ? ? ? ?Reviewed: ?Allergy & Precautions, NPO status  ? ?Airway ?Mallampati: II ? ?TM Distance: >3 FB ? ? ? ? Dental ?  ?Pulmonary ? ?  ?Pulmonary exam normal ? ? ? ? ? ? ? Cardiovascular ?hypertension,  ?Rhythm:Regular Rate:Normal ? ?LBBB ?  ?Neuro/Psych ?  ? GI/Hepatic ?  ?Endo/Other  ?Hypothyroidism  ? Renal/GU ?  ? ?  ?Musculoskeletal ? ?(+) Arthritis ,  ? Abdominal ?  ?Peds ? Hematology ?  ?Anesthesia Other Findings ? ? Reproductive/Obstetrics ? ?  ? ? ? ? ? ? ? ? ? ? ? ? ? ?  ?  ? ? ? ? ? ? ? ? ?Anesthesia Physical ?Anesthesia Plan ? ?ASA: 2 ? ?Anesthesia Plan: MAC  ? ?Post-op Pain Management: Minimal or no pain anticipated  ? ?Induction: Intravenous ? ?PONV Risk Score and Plan: TIVA, Midazolam and Treatment may vary due to age or medical condition ? ?Airway Management Planned: Natural Airway and Nasal Cannula ? ?Additional Equipment:  ? ?Intra-op Plan:  ? ?Post-operative Plan:  ? ?Informed Consent: I have reviewed the patients History and Physical, chart, labs and discussed the procedure including the risks, benefits and alternatives for the proposed anesthesia with the patient or authorized representative who has indicated his/her understanding and acceptance.  ? ? ? ? ? ?Plan Discussed with: CRNA ? ?Anesthesia Plan Comments:   ? ? ? ? ? ? ?Anesthesia Quick Evaluation ? ?

## 2022-03-03 NOTE — H&P (Signed)
[facility]  ? ?Primary Care Physician:  Lauro Regulus, MD ?Ophthalmologist: Dr. Maren Reamer ? ?Pre-Procedure History & Physical: ?HPI:  Krystal Sanchez is a 81 y.o. female here for cataract surgery. ?  ?Past Medical History:  ?Diagnosis Date  ? Adnexal mass   ? Arthritis   ? Hard of hearing   ? Hypertension   ? Hypothyroidism   ? Pelvic pressure in female   ? Pelvic relaxation   ? ? ?Past Surgical History:  ?Procedure Laterality Date  ? LEEP    ? ORTHOPEDIC SURGERY    ? ? ?Prior to Admission medications   ?Medication Sig Start Date End Date Taking? Authorizing Provider  ?atorvastatin (LIPITOR) 40 MG tablet Take 40 mg by mouth daily. 08/30/17  Yes [provider]  ?levothyroxine (SYNTHROID) 100 MCG tablet Take 75 mcg by mouth daily before breakfast.   Yes [provider]  ?losartan-hydrochlorothiazide (HYZAAR) 100-12.5 MG tablet Take 1 tablet by mouth daily. 09/18/17  Yes [provider]  ?potassium chloride (KLOR-CON) 10 MEQ tablet Take 10 mEq by mouth daily.   Yes [provider]  ?Vitamin D, Ergocalciferol, (DRISDOL) 50000 units CAPS capsule TAKE 1 CAPSULE BY MOUTH ONCE A WEEK AS DIRECTED 09/18/17  Yes [provider]  ? ? ?Allergies as of 02/18/2022  ? (No Known Allergies)  ? ? ?Family History  ?Problem Relation Age of Onset  ? Parkinson's disease Father   ? Breast cancer Other   ? ? ?Social History  ? ?Socioeconomic History  ? Marital status: Widowed  ?  Spouse name: Not on file  ? Number of children: Not on file  ? Years of education: Not on file  ? Highest education level: Not on file  ?Occupational History  ? Not on file  ?Tobacco Use  ? Smoking status: Never  ? Smokeless tobacco: Never  ?Vaping Use  ? Vaping Use: Never used  ?Substance and Sexual Activity  ? Alcohol use: No  ? Drug use: No  ? Sexual activity: Not Currently  ?Other Topics Concern  ? Not on file  ?Social History Narrative  ? Not on file  ? ?Social Determinants of Health  ? ?Financial  Resource Strain: Not on file  ?Food Insecurity: Not on file  ?Transportation Needs: Not on file  ?Physical Activity: Not on file  ?Stress: Not on file  ?Social Connections: Not on file  ?Intimate Partner Violence: Not on file  ? ? ?Review of Systems: ?See HPI, otherwise negative ROS ? ?Physical Exam: ?BP (!) 159/78   Pulse 83   Temp (!) 97 ?F (36.1 ?C)   Ht 5\' 5"  (1.651 m)   Wt 59.9 kg   SpO2 100%   BMI 21.97 kg/m?  ?General:   Alert, cooperative in NAD ?Head:  Normocephalic and atraumatic. ?Respiratory:  Normal work of breathing. ?Cardiovascular:  RRR ? ?Impression/Plan: ?Krystal Sanchez is here for cataract surgery. ? ?Risks, benefits, limitations, and alternatives regarding cataract surgery have been reviewed with the patient.  Questions have been answered.  All parties agreeable. ? ? ?Judd Gaudier, MD  03/03/2022, 11:09 AM ? ? ?

## 2022-03-03 NOTE — Transfer of Care (Signed)
Immediate Anesthesia Transfer of Care Note ? ?Patient: Krystal Sanchez ? ?Procedure(s) Performed: CATARACT EXTRACTION PHACO AND INTRAOCULAR LENS PLACEMENT (IOC) LEFT (Left: Eye) ? ?Patient Location: PACU ? ?Anesthesia Type: MAC ? ?Level of Consciousness: awake, alert  and patient cooperative ? ?Airway and Oxygen Therapy: Patient Spontanous Breathing and Patient connected to supplemental oxygen ? ?Post-op Assessment: Post-op Vital signs reviewed, Patient's Cardiovascular Status Stable, Respiratory Function Stable, Patent Airway and No signs of Nausea or vomiting ? ?Post-op Vital Signs: Reviewed and stable ? ?Complications: No notable events documented. ? ?

## 2022-03-03 NOTE — Anesthesia Postprocedure Evaluation (Signed)
Anesthesia Post Note ? ?Patient: Krystal Sanchez ? ?Procedure(s) Performed: CATARACT EXTRACTION PHACO AND INTRAOCULAR LENS PLACEMENT (IOC) LEFT (Left: Eye) ? ? ?  ?Patient location during evaluation: PACU ?Anesthesia Type: MAC ?Level of consciousness: awake ?Pain management: pain level controlled ?Vital Signs Assessment: post-procedure vital signs reviewed and stable ?Respiratory status: respiratory function stable ?Cardiovascular status: stable ?Postop Assessment: no apparent nausea or vomiting ?Anesthetic complications: no ? ? ?No notable events documented. ? ?Veda Canning ? ? ? ? ? ?

## 2022-03-09 ENCOUNTER — Encounter: Payer: Self-pay | Admitting: Ophthalmology

## 2022-03-11 NOTE — Discharge Instructions (Signed)

## 2022-03-17 ENCOUNTER — Ambulatory Visit: Payer: Medicare Other | Admitting: Anesthesiology

## 2022-03-17 ENCOUNTER — Ambulatory Visit
Admission: RE | Admit: 2022-03-17 | Discharge: 2022-03-17 | Disposition: A | Discharge status: Home / Self Care | Payer: Medicare Other | Attending: Ophthalmology | Admitting: Ophthalmology

## 2022-03-17 ENCOUNTER — Encounter: Payer: Self-pay | Admitting: Ophthalmology

## 2022-03-17 ENCOUNTER — Other Ambulatory Visit: Payer: Self-pay

## 2022-03-17 ENCOUNTER — Encounter
Admission: RE | Disposition: A | Discharge status: Home / Self Care | Payer: Self-pay | Source: Home / Self Care | Attending: Ophthalmology

## 2022-03-17 DIAGNOSIS — M1990 Unspecified osteoarthritis, unspecified site: Secondary | ICD-10-CM | POA: Diagnosis not present

## 2022-03-17 DIAGNOSIS — I10 Essential (primary) hypertension: Secondary | ICD-10-CM | POA: Insufficient documentation

## 2022-03-17 DIAGNOSIS — H2511 Age-related nuclear cataract, right eye: Secondary | ICD-10-CM | POA: Diagnosis not present

## 2022-03-17 DIAGNOSIS — E039 Hypothyroidism, unspecified: Secondary | ICD-10-CM | POA: Insufficient documentation

## 2022-03-17 DIAGNOSIS — I447 Left bundle-branch block, unspecified: Secondary | ICD-10-CM | POA: Insufficient documentation

## 2022-03-17 HISTORY — PX: CATARACT EXTRACTION W/PHACO: SHX586

## 2022-03-17 SURGERY — PHACOEMULSIFICATION, CATARACT, WITH IOL INSERTION
Anesthesia: Monitor Anesthesia Care | Site: Eye | Laterality: Right

## 2022-03-17 MED ORDER — TETRACAINE HCL 0.5 % OP SOLN
1.0000 [drp] | OPHTHALMIC | Status: DC | PRN
  Administered 2022-03-17 (×3): 1 [drp] via OPHTHALMIC

## 2022-03-17 MED ORDER — ARMC OPHTHALMIC DILATING DROPS
1.0000 "application " | OPHTHALMIC | Status: DC | PRN
  Administered 2022-03-17 (×3): 1 via OPHTHALMIC

## 2022-03-17 MED ORDER — MIDAZOLAM HCL 2 MG/2ML IJ SOLN
INTRAMUSCULAR | Status: DC | PRN
  Administered 2022-03-17: 1.5 mg via INTRAVENOUS

## 2022-03-17 MED ORDER — FENTANYL CITRATE (PF) 100 MCG/2ML IJ SOLN
INTRAMUSCULAR | Status: DC | PRN
  Administered 2022-03-17: 50 ug via INTRAVENOUS

## 2022-03-17 MED ORDER — BRIMONIDINE TARTRATE-TIMOLOL 0.2-0.5 % OP SOLN
OPHTHALMIC | Status: DC | PRN
  Administered 2022-03-17: 1 [drp] via OPHTHALMIC

## 2022-03-17 MED ORDER — SIGHTPATH DOSE#1 BSS IO SOLN
INTRAOCULAR | Status: DC | PRN
  Administered 2022-03-17: 15 mL

## 2022-03-17 MED ORDER — SIGHTPATH DOSE#1 NA CHONDROIT SULF-NA HYALURON 40-17 MG/ML IO SOLN
INTRAOCULAR | Status: DC | PRN
  Administered 2022-03-17: 1 mL via INTRAOCULAR

## 2022-03-17 MED ORDER — MOXIFLOXACIN HCL 0.5 % OP SOLN
OPHTHALMIC | Status: DC | PRN
  Administered 2022-03-17: 0.2 mL via OPHTHALMIC

## 2022-03-17 MED ORDER — EPINEPHRINE PF 1 MG/ML IJ SOLN
INTRAMUSCULAR | Status: DC | PRN
  Administered 2022-03-17: 76 mL via OPHTHALMIC

## 2022-03-17 MED ORDER — SIGHTPATH DOSE#1 BSS IO SOLN
INTRAOCULAR | Status: DC | PRN
  Administered 2022-03-17: 1 mL

## 2022-03-17 SURGICAL SUPPLY — 16 items
CANNULA ANT/CHMB 27G (MISCELLANEOUS) IMPLANT
CANNULA ANT/CHMB 27GA (MISCELLANEOUS) IMPLANT
CATARACT SUITE SIGHTPATH (MISCELLANEOUS) ×2 IMPLANT
FEE CATARACT SUITE SIGHTPATH (MISCELLANEOUS) ×1 IMPLANT
GLOVE SURG ENC TEXT LTX SZ8 (GLOVE) ×2 IMPLANT
GLOVE SURG TRIUMPH 8.0 PF LTX (GLOVE) ×2 IMPLANT
LENS IOL TECNIS EYHANCE 24.0 (Intraocular Lens) ×1 IMPLANT
NDL FILTER BLUNT 18X1 1/2 (NEEDLE) ×1 IMPLANT
NEEDLE FILTER BLUNT 18X 1/2SAF (NEEDLE) ×1
NEEDLE FILTER BLUNT 18X1 1/2 (NEEDLE) ×1 IMPLANT
PACK VIT ANT 23G (MISCELLANEOUS) IMPLANT
RING MALYGIN (MISCELLANEOUS) IMPLANT
SUT ETHILON 10-0 CS-B-6CS-B-6 (SUTURE)
SUTURE EHLN 10-0 CS-B-6CS-B-6 (SUTURE) IMPLANT
SYR 3ML LL SCALE MARK (SYRINGE) ×2 IMPLANT
WATER STERILE IRR 250ML POUR (IV SOLUTION) ×2 IMPLANT

## 2022-03-17 NOTE — Op Note (Signed)
PREOPERATIVE DIAGNOSIS:  Nuclear sclerotic cataract of the right eye.   POSTOPERATIVE DIAGNOSIS:  Cataract   OPERATIVE PROCEDURE:ORPROCALL@   SURGEON:  Krystal Manila, MD.   ANESTHESIA:  Anesthesiologist: Jola Babinski, MD CRNA: Michaele Offer, CRNA  1.      Managed anesthesia care. 2.      0.82ml of Shugarcaine was instilled in the eye following the paracentesis.   COMPLICATIONS:  None.   TECHNIQUE:   Stop and chop   DESCRIPTION OF PROCEDURE:  The patient was examined and consented in the preoperative holding area where the aforementioned topical anesthesia was applied to the right eye and then brought back to the Operating Room where the right eye was prepped and draped in the usual sterile ophthalmic fashion and a lid speculum was placed. A paracentesis was created with the side port blade and the anterior chamber was filled with viscoelastic. A near clear corneal incision was performed with the steel keratome. A continuous curvilinear capsulorrhexis was performed with a cystotome followed by the capsulorrhexis forceps. Hydrodissection and hydrodelineation were carried out with BSS on a blunt cannula. The lens was removed in a stop and chop  technique and the remaining cortical material was removed with the irrigation-aspiration handpiece. The capsular bag was inflated with viscoelastic and the Technis [id number]  lens was placed in the capsular bag without complication. The remaining viscoelastic was removed from the eye with the irrigation-aspiration handpiece. The wounds were hydrated. The anterior chamber was flushed with BSS and the eye was inflated to physiologic pressure. 0.43ml of Vigamox was placed in the anterior chamber. The wounds were found to be water tight. The eye was dressed with Combigan. The patient was given protective glasses to wear throughout the day and a shield with which to sleep tonight. The patient was also given drops with which to begin a drop regimen today and will  follow-up with me in one day. Implant Name Type Inv. Item Serial No. Manufacturer Lot No. LRB No. Used Action  LENS IOL TECNIS EYHANCE 24.0 - [id number] Intraocular Lens LENS IOL TECNIS EYHANCE 24.0 [id number] [employer]  Right 1 Implanted   Procedure(s) with comments: CATARACT EXTRACTION PHACO AND INTRAOCULAR LENS PLACEMENT (IOC) RIGHT (Right) - 25.36 2:00.5  Electronically signed: Galen Sanchez 03/17/2022 11:29 AM

## 2022-03-17 NOTE — Anesthesia Postprocedure Evaluation (Signed)
Anesthesia Post Note  Patient: Krystal Sanchez  Procedure(s) Performed: CATARACT EXTRACTION PHACO AND INTRAOCULAR LENS PLACEMENT (IOC) RIGHT (Right: Eye)     Patient location during evaluation: PACU Anesthesia Type: MAC Level of consciousness: awake Pain management: pain level controlled Vital Signs Assessment: post-procedure vital signs reviewed and stable Respiratory status: respiratory function stable Cardiovascular status: stable Postop Assessment: no apparent nausea or vomiting Anesthetic complications: no   No notable events documented.  Veda Canning

## 2022-03-17 NOTE — Anesthesia Preprocedure Evaluation (Signed)
Anesthesia Evaluation  Patient identified by MRN, date of birth, ID band Patient awake    Reviewed: Allergy & Precautions, NPO status   Airway Mallampati: II  TM Distance: >3 FB     Dental   Pulmonary    Pulmonary exam normal        Cardiovascular hypertension,  Rhythm:Regular Rate:Normal  LBBB   Neuro/Psych    GI/Hepatic   Endo/Other  Hypothyroidism   Renal/GU      Musculoskeletal  (+) Arthritis ,   Abdominal   Peds  Hematology   Anesthesia Other Findings   Reproductive/Obstetrics                             Anesthesia Physical  Anesthesia Plan  ASA: 2  Anesthesia Plan: MAC   Post-op Pain Management: Minimal or no pain anticipated   Induction: Intravenous  PONV Risk Score and Plan: 2 and TIVA, Midazolam and Treatment may vary due to age or medical condition  Airway Management Planned: Natural Airway and Nasal Cannula  Additional Equipment:   Intra-op Plan:   Post-operative Plan:   Informed Consent: I have reviewed the patients History and Physical, chart, labs and discussed the procedure including the risks, benefits and alternatives for the proposed anesthesia with the patient or authorized representative who has indicated his/her understanding and acceptance.       Plan Discussed with: CRNA  Anesthesia Plan Comments:         Anesthesia Quick Evaluation

## 2022-03-17 NOTE — H&P (Signed)
[facility]   Primary Care Physician:  Lauro Regulus, MD Ophthalmologist: Dr. Druscilla Brownie  Pre-Procedure History & Physical: HPI:  Krystal Sanchez is a 81 y.o. female here for cataract surgery.   Past Medical History:  Diagnosis Date   Adnexal mass    Arthritis    Hard of hearing    Hypertension    Hypothyroidism    Pelvic pressure in female    Pelvic relaxation     Past Surgical History:  Procedure Laterality Date   CATARACT EXTRACTION W/PHACO Left 03/03/2022   Procedure: CATARACT EXTRACTION PHACO AND INTRAOCULAR LENS PLACEMENT (IOC) LEFT;  Surgeon: Galen Manila, MD;  Location: [facility];  Service: Ophthalmology;  Laterality: Left;  19.32 01:33.0   LEEP     ORTHOPEDIC SURGERY      Prior to Admission medications   Medication Sig Start Date End Date Taking? Authorizing Provider  atorvastatin (LIPITOR) 40 MG tablet Take 40 mg by mouth daily. 08/30/17  Yes [provider]  levothyroxine (SYNTHROID) 100 MCG tablet Take 75 mcg by mouth daily before breakfast.   Yes [provider]  losartan-hydrochlorothiazide (HYZAAR) 100-12.5 MG tablet Take 1 tablet by mouth daily. 09/18/17  Yes [provider]  potassium chloride (KLOR-CON) 10 MEQ tablet Take 10 mEq by mouth daily.   Yes [provider]  Vitamin D, Ergocalciferol, (DRISDOL) 50000 units CAPS capsule TAKE 1 CAPSULE BY MOUTH ONCE A WEEK AS DIRECTED 09/18/17  Yes [provider]    Allergies as of 02/18/2022   (No Known Allergies)    Family History  Problem Relation Age of Onset   Parkinson's disease Father    Breast cancer Other     Social History   Socioeconomic History   Marital status: Widowed    Spouse name: Not on file   Number of children: Not on file   Years of education: Not on file   Highest education level: Not on file  Occupational History   Not on file  Tobacco Use   Smoking status: Never   Smokeless tobacco: Never  Vaping Use    Vaping Use: Never used  Substance and Sexual Activity   Alcohol use: No   Drug use: No   Sexual activity: Not Currently  Other Topics Concern   Not on file  Social History Narrative   Not on file   Social Determinants of Health   Financial Resource Strain: Not on file  Food Insecurity: Not on file  Transportation Needs: Not on file  Physical Activity: Not on file  Stress: Not on file  Social Connections: Not on file  Intimate Partner Violence: Not on file    Review of Systems: See HPI, otherwise negative ROS  Physical Exam: BP (!) 160/58   Pulse 84   Temp (!) 97.5 F (36.4 C) (Temporal)   Ht 5\' 5"  (1.651 m)   Wt 60.8 kg   SpO2 98%   BMI 22.30 kg/m  General:   Alert, cooperative in NAD Head:  Normocephalic and atraumatic. Respiratory:  Normal work of breathing. Cardiovascular:  RRR  Impression/Plan: Krystal Sanchez is here for cataract surgery.  Risks, benefits, limitations, and alternatives regarding cataract surgery have been reviewed with the patient.  Questions have been answered.  All parties agreeable.   Judd Gaudier, MD  03/17/2022, 11:02 AM

## 2022-03-17 NOTE — Transfer of Care (Signed)
Immediate Anesthesia Transfer of Care Note  Patient: Krystal Sanchez  Procedure(s) Performed: CATARACT EXTRACTION PHACO AND INTRAOCULAR LENS PLACEMENT (IOC) RIGHT (Right: Eye)  Patient Location: PACU  Anesthesia Type: MAC  Level of Consciousness: awake, alert  and patient cooperative  Airway and Oxygen Therapy: Patient Spontanous Breathing and Patient connected to supplemental oxygen  Post-op Assessment: Post-op Vital signs reviewed, Patient's Cardiovascular Status Stable, Respiratory Function Stable, Patent Airway and No signs of Nausea or vomiting  Post-op Vital Signs: Reviewed and stable  Complications: No notable events documented.

## 2022-03-18 ENCOUNTER — Encounter: Payer: Self-pay | Admitting: Ophthalmology

## 2022-05-03 ENCOUNTER — Inpatient Hospital Stay
Admission: EM | Admit: 2022-05-03 | Discharge: 2022-05-07 | DRG: 522 | Disposition: A | Discharge status: Skilled Nursing Facility | Payer: Medicare Other | Attending: Obstetrics and Gynecology | Admitting: Obstetrics and Gynecology

## 2022-05-03 ENCOUNTER — Emergency Department: Payer: Medicare Other

## 2022-05-03 DIAGNOSIS — Z803 Family history of malignant neoplasm of breast: Secondary | ICD-10-CM | POA: Diagnosis not present

## 2022-05-03 DIAGNOSIS — Y92008 Other place in unspecified non-institutional (private) residence as the place of occurrence of the external cause: Secondary | ICD-10-CM

## 2022-05-03 DIAGNOSIS — Z820 Family history of epilepsy and other diseases of the nervous system: Secondary | ICD-10-CM

## 2022-05-03 DIAGNOSIS — E039 Hypothyroidism, unspecified: Secondary | ICD-10-CM | POA: Diagnosis present

## 2022-05-03 DIAGNOSIS — I129 Hypertensive chronic kidney disease with stage 1 through stage 4 chronic kidney disease, or unspecified chronic kidney disease: Secondary | ICD-10-CM | POA: Diagnosis present

## 2022-05-03 DIAGNOSIS — I248 Other forms of acute ischemic heart disease: Secondary | ICD-10-CM | POA: Diagnosis present

## 2022-05-03 DIAGNOSIS — S72002A Fracture of unspecified part of neck of left femur, initial encounter for closed fracture: Principal | ICD-10-CM

## 2022-05-03 DIAGNOSIS — N183 Chronic kidney disease, stage 3 unspecified: Secondary | ICD-10-CM | POA: Diagnosis present

## 2022-05-03 DIAGNOSIS — S72012A Unspecified intracapsular fracture of left femur, initial encounter for closed fracture: Secondary | ICD-10-CM | POA: Diagnosis present

## 2022-05-03 DIAGNOSIS — M810 Age-related osteoporosis without current pathological fracture: Secondary | ICD-10-CM | POA: Diagnosis present

## 2022-05-03 DIAGNOSIS — N83209 Unspecified ovarian cyst, unspecified side: Secondary | ICD-10-CM | POA: Diagnosis not present

## 2022-05-03 DIAGNOSIS — Z79899 Other long term (current) drug therapy: Secondary | ICD-10-CM

## 2022-05-03 DIAGNOSIS — I447 Left bundle-branch block, unspecified: Secondary | ICD-10-CM | POA: Diagnosis present

## 2022-05-03 DIAGNOSIS — S50312A Abrasion of left elbow, initial encounter: Secondary | ICD-10-CM | POA: Diagnosis present

## 2022-05-03 DIAGNOSIS — E038 Other specified hypothyroidism: Secondary | ICD-10-CM | POA: Diagnosis not present

## 2022-05-03 DIAGNOSIS — Z79890 Hormone replacement therapy (postmenopausal): Secondary | ICD-10-CM

## 2022-05-03 DIAGNOSIS — I10 Essential (primary) hypertension: Secondary | ICD-10-CM | POA: Diagnosis not present

## 2022-05-03 DIAGNOSIS — W109XXA Fall (on) (from) unspecified stairs and steps, initial encounter: Secondary | ICD-10-CM | POA: Diagnosis present

## 2022-05-03 DIAGNOSIS — S72009A Fracture of unspecified part of neck of unspecified femur, initial encounter for closed fracture: Secondary | ICD-10-CM | POA: Diagnosis present

## 2022-05-03 LAB — COMPREHENSIVE METABOLIC PANEL
ALT: 20 U/L (ref 0–44)
AST: 16 U/L (ref 15–41)
Albumin: 3.9 g/dL (ref 3.5–5.0)
Alkaline Phosphatase: 64 U/L (ref 38–126)
Anion gap: 7 (ref 5–15)
BUN: 31 mg/dL — ABNORMAL HIGH (ref 8–23)
CO2: 21 mmol/L — ABNORMAL LOW (ref 22–32)
Calcium: 8.8 mg/dL — ABNORMAL LOW (ref 8.9–10.3)
Chloride: 110 mmol/L (ref 98–111)
Creatinine, Ser: 1.15 mg/dL — ABNORMAL HIGH (ref 0.44–1.00)
GFR, Estimated: 48 mL/min — ABNORMAL LOW (ref >60)
Glucose, Bld: 153 mg/dL — ABNORMAL HIGH (ref 70–99)
Potassium: 3.8 mmol/L (ref 3.5–5.1)
Sodium: 138 mmol/L (ref 135–145)
Total Bilirubin: 0.9 mg/dL (ref 0.3–1.2)
Total Protein: 6.3 g/dL — ABNORMAL LOW (ref 6.5–8.1)

## 2022-05-03 LAB — BRAIN NATRIURETIC PEPTIDE: B Natriuretic Peptide: 66.3 pg/mL (ref 0.0–100.0)

## 2022-05-03 LAB — CBC
HCT: 40.3 % (ref 36.0–46.0)
Hemoglobin: 13 g/dL (ref 12.0–15.0)
MCH: 31.1 pg (ref 26.0–34.0)
MCHC: 32.3 g/dL (ref 30.0–36.0)
MCV: 96.4 fL (ref 80.0–100.0)
Platelets: 264 10*3/uL (ref 150–400)
RBC: 4.18 MIL/uL (ref 3.87–5.11)
RDW: 12.4 % (ref 11.5–15.5)
WBC: 18.3 10*3/uL — ABNORMAL HIGH (ref 4.0–10.5)
nRBC: 0 % (ref 0.0–0.2)

## 2022-05-03 LAB — CALCIUM: Calcium: 8.3 mg/dL — ABNORMAL LOW (ref 8.9–10.3)

## 2022-05-03 LAB — PROTIME-INR
INR: 1.1 (ref 0.8–1.2)
Prothrombin Time: 13.6 seconds (ref 11.4–15.2)

## 2022-05-03 MED ORDER — SENNOSIDES-DOCUSATE SODIUM 8.6-50 MG PO TABS
1.0000 | ORAL_TABLET | Freq: Every evening | ORAL | Status: DC | PRN
  Administered 2022-05-05: 1 via ORAL
  Filled 2022-05-03: qty 1

## 2022-05-03 MED ORDER — LEVOTHYROXINE SODIUM 50 MCG PO TABS
75.0000 ug | ORAL_TABLET | Freq: Every day | ORAL | Status: DC
  Administered 2022-05-05 – 2022-05-07 (×3): 75 ug via ORAL
  Filled 2022-05-03 (×3): qty 1

## 2022-05-03 MED ORDER — LOSARTAN POTASSIUM-HCTZ 100-12.5 MG PO TABS: 1.0000 | ORAL_TABLET | Freq: Every day | ORAL | Status: DC

## 2022-05-03 MED ORDER — HYDROCHLOROTHIAZIDE 12.5 MG PO TABS
12.5000 mg | ORAL_TABLET | Freq: Every day | ORAL | Status: DC
  Administered 2022-05-04 – 2022-05-07 (×4): 12.5 mg via ORAL
  Filled 2022-05-03 (×4): qty 1

## 2022-05-03 MED ORDER — SODIUM CHLORIDE 0.9 % IV SOLN: INTRAVENOUS | Status: DC

## 2022-05-03 MED ORDER — CEFAZOLIN SODIUM-DEXTROSE 2-4 GM/100ML-% IV SOLN
2.0000 g | INTRAVENOUS | Status: AC
  Administered 2022-05-04: 2 g via INTRAVENOUS

## 2022-05-03 MED ORDER — LOSARTAN POTASSIUM 50 MG PO TABS
100.0000 mg | ORAL_TABLET | Freq: Every day | ORAL | Status: DC
  Administered 2022-05-04 – 2022-05-07 (×4): 100 mg via ORAL
  Filled 2022-05-03 (×4): qty 2

## 2022-05-03 MED ORDER — DOCUSATE SODIUM 100 MG PO CAPS
100.0000 mg | ORAL_CAPSULE | Freq: Two times a day (BID) | ORAL | Status: DC
  Administered 2022-05-04 – 2022-05-07 (×7): 100 mg via ORAL
  Filled 2022-05-03 (×7): qty 1

## 2022-05-03 MED ORDER — ATORVASTATIN CALCIUM 20 MG PO TABS
40.0000 mg | ORAL_TABLET | Freq: Every day | ORAL | Status: DC
  Administered 2022-05-04 – 2022-05-06 (×3): 40 mg via ORAL
  Filled 2022-05-03 (×3): qty 2

## 2022-05-03 MED ORDER — VITAMIN D (ERGOCALCIFEROL) 1.25 MG (50000 UNIT) PO CAPS
50000.0000 [IU] | ORAL_CAPSULE | ORAL | Status: DC
  Filled 2022-05-03: qty 1

## 2022-05-03 MED ORDER — TRANEXAMIC ACID-NACL 1000-0.7 MG/100ML-% IV SOLN
1000.0000 mg | INTRAVENOUS | Status: AC
  Administered 2022-05-04: 1000 mg via INTRAVENOUS
  Filled 2022-05-03: qty 100

## 2022-05-03 MED ORDER — FENTANYL CITRATE PF 50 MCG/ML IJ SOSY
50.0000 ug | PREFILLED_SYRINGE | INTRAMUSCULAR | Status: DC | PRN
  Administered 2022-05-03 (×3): 50 ug via INTRAVENOUS
  Filled 2022-05-03 (×3): qty 1

## 2022-05-03 MED ORDER — CHLORHEXIDINE GLUCONATE 4 % EX LIQD: Freq: Once | CUTANEOUS | Status: DC

## 2022-05-03 MED ORDER — HEPARIN SODIUM (PORCINE) 5000 UNIT/ML IJ SOLN: 5000.0000 [IU] | Freq: Three times a day (TID) | INTRAMUSCULAR | Status: DC

## 2022-05-03 MED ORDER — HYDROCODONE-ACETAMINOPHEN 5-325 MG PO TABS: 1.0000 | ORAL_TABLET | Freq: Four times a day (QID) | ORAL | Status: DC | PRN

## 2022-05-03 MED ORDER — MORPHINE SULFATE (PF) 2 MG/ML IV SOLN: 1.0000 mg | INTRAVENOUS | Status: DC | PRN

## 2022-05-03 MED ORDER — HYDROCODONE-ACETAMINOPHEN 5-325 MG PO TABS
1.0000 | ORAL_TABLET | ORAL | Status: DC | PRN
  Administered 2022-05-04 – 2022-05-06 (×8): 1 via ORAL
  Filled 2022-05-03 (×9): qty 1

## 2022-05-03 MED ORDER — PANTOPRAZOLE SODIUM 40 MG PO TBEC
40.0000 mg | DELAYED_RELEASE_TABLET | Freq: Every day | ORAL | Status: DC | PRN
  Administered 2022-05-06: 40 mg via ORAL
  Filled 2022-05-03: qty 1

## 2022-05-03 MED ORDER — MORPHINE SULFATE (PF) 2 MG/ML IV SOLN: 0.5000 mg | INTRAVENOUS | Status: DC | PRN

## 2022-05-03 MED ORDER — FENTANYL CITRATE PF 50 MCG/ML IJ SOSY
50.0000 ug | PREFILLED_SYRINGE | Freq: Once | INTRAMUSCULAR | Status: AC
  Administered 2022-05-03: 50 ug via INTRAVENOUS
  Filled 2022-05-03: qty 1

## 2022-05-03 NOTE — ED Provider Notes (Signed)
[facility] Provider Note    None    (approximate)  History   Chief Complaint: Left hip pain HPI  Krystal Sanchez is a 81 y.o. female with a past medical history of hypertension presents emergency department for a fall and left hip pain.  According to the patient she tripped falling onto her left side.  She has been experiencing significant pain in the left hip ever since unable to stand or bear weight.  Patient states she did hit her head but denies LOC denies anticoagulation denies any headache.  Denies any weakness or numbness but does states significant pain in the left hip/pelvis anytime she attempts to move the left lower extremity.  Physical Exam   Triage Vital Signs: ED Triage Vitals  Enc Vitals Group     BP      Pulse      Resp      Temp      Temp src      SpO2      Weight      Height      Head Circumference      Peak Flow      Pain Score      Pain Loc      Pain Edu?      Excl. in GC?     Most recent vital signs: There were no vitals filed for this visit.  General: Awake, no distress.  CV:  Good peripheral perfusion.  Regular rate and rhythm  Resp:  Normal effort.  Equal breath sounds bilaterally.  Abd:  No distention.  Soft, nontender.  No rebound or guarding. Other:  Small skin tear to left elbow.  Patient has moderate tenderness to palpation of the left hip, significant pain with any attempted range of motion.  Neurovascular intact distally.   ED Results / Procedures / Treatments   EKG  EKG viewed and interpreted by myself shows sinus tachycardia 114 bpm with a widened QRS, normal axis, largely normal intervals with nonspecific ST changes.  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  RADIOLOGY  I have viewed and interpreted the CT pelvis images patient appears to have a left femoral neck fracture. CT imaging read by radiology shows an impacted subcapital left femoral neck fracture.  9.1 cm adnexal cyst. CT head  shows no acute abnormality  MEDICATIONS ORDERED IN ED: Medications  fentaNYL (SUBLIMAZE) injection 50 mcg (has no administration in time range)     IMPRESSION / MDM / ASSESSMENT AND PLAN / ED COURSE  I reviewed the triage vital signs and the nursing notes.  Patient's presentation is most consistent with acute presentation with potential threat to life or bodily function.  Patient presents emergency department for left hip pain after a fall tonight.  Differential would include hip fracture, less likely dislocation, pelvis fracture, femur fracture.  We will obtain x-ray images of the hip and pelvis.  We will obtain a CT scan of the head given the head injury with the fall.  We will check labs EKG and chest x-ray in anticipation for likely hip fracture and operation.  Patient agreeable to plan of care.  We will dose additional 50 mcg of fentanyl prior to imaging.  Patient has received 200 mcg of fentanyl by EMS prior to arrival.  Patient remains awake alert oriented and complaining of pain in the left hip.  CT of the hip confirms left femoral neck fracture.  CT head shows no acute abnormality.  CBC  shows moderate leukocytosis likely related to the pain.  Chemistry shows no significant abnormality.  Patient continues to be in pain we will continue to provide pain medication as needed.  I spoke to Dr. Hyacinth Meeker of orthopedics who will likely repair in the afternoon.  I spoke to the hospitalist who will be admitting to their service.  FINAL CLINICAL IMPRESSION(S) / ED DIAGNOSES   Left femur fracture Fall   Note:  This document was prepared using Dragon voice recognition software and may include unintentional dictation errors.   Minna Antis, MD 05/03/22 2317

## 2022-05-03 NOTE — H&P (Signed)
History and Physical    Krystal Sanchez:[id number] DOB: 1941/02/06 DOA: 05/03/2022  PCP: Lauro Regulus, MD  Patient coming from: home  I have personally briefly reviewed patient's old medical records in [facility]  Chief Complaint: trip and fall with left hip pain  HPI: Krystal Sanchez is a 81 y.o. female with medical history significant of  HTN, hypothyroidism ,arthritis who has trip and fall down stairs landing on concrete with immediate severe left hip pain. EMS was called and brought patient to ED  ED Course:  Afeb, bp 131/112, hr 112, rr 20 , sat 98% on ra   Wbc 18.3, hgb 13, plt 264 NA:138, K 3.8, CL 110, bicarb 21, Glu 153, cr 1.15  CTH IMPRESSION: 1. No acute intracranial process. 2. 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 3. Left frontal lobe encephalomalacia consistent with prior postsurgical or chronic ischemic change.  CT Pelvis IMPRESSION: 1. Impacted subcapital left femoral neck fracture with ventral and varus angulation at the fracture site. 2. 9.1 cm simple appearing left adnexal cyst, increased since prior ultrasound 09/16/2020 where it had measured up to 7.5 cm. If not previously performed, and if the patient would be a therapy candidate should neoplasm be detected, nonemergent outpatient MRI follow-up or gynecologic surgical consultation may be useful.  Cxr: . 1.Borderline mild cardiomegaly likely accentuated by technique. 2. Low lung volumes.  Peribronchial thickening of unknown acuity. Tx fentanyl    Review of Systems: As per HPI otherwise 10 point review of systems negative.   Past Medical History:  Diagnosis Date   Adnexal mass    Arthritis    Hard of hearing    Hypertension    Hypothyroidism    Pelvic pressure in female    Pelvic relaxation     Past Surgical History:  Procedure Laterality Date   CATARACT EXTRACTION W/PHACO Left 03/03/2022   Procedure: CATARACT  EXTRACTION PHACO AND INTRAOCULAR LENS PLACEMENT (IOC) LEFT;  Surgeon: Galen Manila, MD;  Location: [facility];  Service: Ophthalmology;  Laterality: Left;  19.32 01:33.0   CATARACT EXTRACTION W/PHACO Right 03/17/2022   Procedure: CATARACT EXTRACTION PHACO AND INTRAOCULAR LENS PLACEMENT (IOC) RIGHT;  Surgeon: Galen Manila, MD;  Location: [facility];  Service: Ophthalmology;  Laterality: Right;  25.36 2:00.5   LEEP     ORTHOPEDIC SURGERY       reports that she has never smoked. She has never used smokeless tobacco. She reports that she does not drink alcohol and does not use drugs.  No Known Allergies  Family History  Problem Relation Age of Onset   Parkinson's disease Father    Breast cancer Other     Prior to Admission medications   Medication Sig Start Date End Date Taking? Authorizing Provider  atorvastatin (LIPITOR) 40 MG tablet Take 40 mg by mouth daily. 08/30/17  Yes [provider]  levothyroxine (SYNTHROID) 75 MCG tablet Take 75 mcg by mouth every morning. 03/21/22  Yes [provider]  losartan-hydrochlorothiazide (HYZAAR) 100-12.5 MG tablet Take 1 tablet by mouth daily. 09/18/17  Yes [provider]  omeprazole (PRILOSEC) 20 MG capsule Take 20 mg by mouth daily as needed.   Yes [provider]  potassium chloride (KLOR-CON) 10 MEQ tablet Take 10 mEq by mouth daily. 03/05/22  Yes [provider]  Vitamin D, Ergocalciferol, (DRISDOL) 50000 units CAPS capsule TAKE 1 CAPSULE BY MOUTH ONCE A WEEK AS DIRECTED 09/18/17  Yes [provider]  Physical Exam: Vitals:   05/03/22 1956 05/03/22 1957 05/03/22 2000 05/03/22 2100  BP: (!) 131/112  (!) 148/51 (!) 158/72  Pulse: (!) 112  (!) 111 (!) 108  Resp: 20  20 18   Temp: 98.4 F (36.9 C)     TempSrc: Oral     SpO2: 98%  98% 97%  Weight:  63.5 kg    Height:  5\' 4"  (1.626 m)      Constitutional: NAD, calm, comfortable Vitals:   05/03/22 1956 05/03/22  1957 05/03/22 2000 05/03/22 2100  BP: (!) 131/112  (!) 148/51 (!) 158/72  Pulse: (!) 112  (!) 111 (!) 108  Resp: 20  20 18   Temp: 98.4 F (36.9 C)     TempSrc: Oral     SpO2: 98%  98% 97%  Weight:  63.5 kg    Height:  5\' 4"  (1.626 m)     Eyes: PERRL, lids and conjunctivae normal ENMT: Mucous membranes are moist. Posterior pharynx clear of any exudate or lesions.Normal dentition.  Neck: normal, supple, no masses, no thyromegaly Respiratory: clear to auscultation bilaterally, no wheezing, no crackles. Normal respiratory effort. No accessory muscle use.  Cardiovascular: Regular rate and rhythm, no murmurs / rubs / gallops. No extremity edema. 2+ pedal pulses. No carotid bruits.  Abdomen: no tenderness, no masses palpated. No hepatosplenomegaly. Bowel sounds positive.  Musculoskeletal: no clubbing / cyanosis. No joint deformity upper and lower extremities. Good ROM, no contractures. Normal muscle tone.  Skin: no rashes, lesions, ulcers. No induration Neurologic: CN 2-12 grossly intact. Sensation intact, DTR normal. Strength 5/5 in all 4.  Psychiatric: Normal judgment and insight. Alert and oriented x 3. Normal mood.    Labs on Admission: I have personally reviewed following labs and imaging studies  CBC: Recent Labs  Lab 05/03/22 2015  WBC 18.3*  HGB 13.0  HCT 40.3  MCV 96.4  PLT 264   Basic Metabolic Panel: Recent Labs  Lab 05/03/22 2015  NA 138  K 3.8  CL 110  CO2 21*  GLUCOSE 153*  BUN 31*  CREATININE 1.15*  CALCIUM 8.8*   GFR: Estimated Creatinine Clearance: 33.1 mL/min (A) (by C-G formula based on SCr of 1.15 mg/dL (H)). Liver Function Tests: Recent Labs  Lab 05/03/22 2015  AST 16  ALT 20  ALKPHOS 64  BILITOT 0.9  PROT 6.3*  ALBUMIN 3.9   No results for input(s): "LIPASE", "AMYLASE" in the last 168 hours. No results for input(s): "AMMONIA" in the last 168 hours. Coagulation Profile: No results for input(s): "INR", "PROTIME" in the last 168  hours. Cardiac Enzymes: No results for input(s): "CKTOTAL", "CKMB", "CKMBINDEX", "TROPONINI" in the last 168 hours. BNP (last 3 results) No results for input(s): "PROBNP" in the last 8760 hours. HbA1C: No results for input(s): "HGBA1C" in the last 72 hours. CBG: No results for input(s): "GLUCAP" in the last 168 hours. Lipid Profile: No results for input(s): "CHOL", "HDL", "LDLCALC", "TRIG", "CHOLHDL", "LDLDIRECT" in the last 72 hours. Thyroid Function Tests: No results for input(s): "TSH", "T4TOTAL", "FREET4", "T3FREE", "THYROIDAB" in the last 72 hours. Anemia Panel: No results for input(s): "VITAMINB12", "FOLATE", "FERRITIN", "TIBC", "IRON", "RETICCTPCT" in the last 72 hours. Urine analysis:    Component Value Date/Time   BILIRUBINUR neg 11/11/2018 1047   PROTEINUR Positive (A) 11/11/2018 1047   UROBILINOGEN 0.2 11/11/2018 1047   NITRITE neg 11/11/2018 1047   LEUKOCYTESUR Negative 11/11/2018 1047    Radiological Exams on Admission: DG Chest Portable 1 View  Result Date:  05/03/2022 CLINICAL DATA:  Preop.  Fall.  Left hip fracture. EXAM: PORTABLE CHEST 1 VIEW COMPARISON:  None Available. FINDINGS: Lung volumes are low. Borderline mild cardiomegaly likely accentuated by technique. Mild peribronchial thickening of unknown acuity. No pneumothorax or large pleural effusion. No acute rib fractures. IMPRESSION: 1. Borderline mild cardiomegaly likely accentuated by technique. 2. Low lung volumes.  Peribronchial thickening of unknown acuity. Electronically Signed   By: Narda Rutherford M.D.   On: 05/03/2022 21:36   CT PELVIS WO CONTRAST  Result Date: 05/03/2022 CLINICAL DATA:  Larey Seat downstairs, left hip pain EXAM: CT PELVIS WITHOUT CONTRAST TECHNIQUE: Multidetector CT imaging of the pelvis was performed following the standard protocol without intravenous contrast. RADIATION DOSE REDUCTION: This exam was performed according to the departmental dose-optimization program which includes automated  exposure control, adjustment of the mA and/or kV according to patient size and/or use of iterative reconstruction technique. COMPARISON:  None Available. FINDINGS: Urinary Tract:  Distal ureters and bladder are grossly unremarkable. Bowel: No bowel obstruction or ileus. No bowel wall thickening or inflammatory change. Vascular/Lymphatic: Atherosclerosis of the aorta and its distal branches. No pathologic adenopathy. Reproductive: There is a 9.1 x 6.2 cm simple appearing left adnexal cyst again noted, previously having measured up to 7.5 cm. If not previously evaluated, and if the patient would be a therapy candidate should neoplasm be detected, MRI follow-up may be useful. Uterus is atrophic. Right adnexa is unremarkable. Other: No free fluid or free intraperitoneal gas. No abdominal wall hernia. Musculoskeletal: There is an acute impacted subcapital left femoral neck fracture, with varus and ventral angulation at the fracture site. No dislocation. The remainder of the visualized bony pelvis is unremarkable. Symmetrical bilateral hip osteoarthritis. Reconstructed images demonstrate no additional findings. IMPRESSION: 1. Impacted subcapital left femoral neck fracture with ventral and varus angulation at the fracture site. 2. 9.1 cm simple appearing left adnexal cyst, increased since prior ultrasound 09/16/2020 where it had measured up to 7.5 cm. If not previously performed, and if the patient would be a therapy candidate should neoplasm be detected, nonemergent outpatient MRI follow-up or gynecologic surgical consultation may be useful. 3.  Aortic Atherosclerosis (ICD10-I70.0). Electronically Signed   By: Sharlet Salina M.D.   On: 05/03/2022 21:02   CT HEAD WO CONTRAST ( )  Result Date: 05/03/2022 CLINICAL DATA:  Larey Seat down stairs, left hip pain EXAM: CT HEAD WITHOUT CONTRAST TECHNIQUE: Contiguous axial images were obtained from the base of the skull through the vertex without intravenous contrast. RADIATION  DOSE REDUCTION: This exam was performed according to the departmental dose-optimization program which includes automated exposure control, adjustment of the mA and/or kV according to patient size and/or use of iterative reconstruction technique. COMPARISON:  None Available. FINDINGS: Brain: Left frontal lobe encephalomalacia consistent with prior infarct or chronic postsurgical change. Hypodensities throughout the periventricular white matter and basal ganglia most consistent with chronic small vessel ischemic changes. No other signs of acute infarct or hemorrhage. Lateral ventricles and remaining midline structures are unremarkable. No acute extra-axial fluid collections. No mass effect. Vascular: No hyperdense vessel or unexpected calcification. Skull: Prior left temporal craniotomy.  No acute bony abnormality. Sinuses/Orbits: No acute finding. Other: None. IMPRESSION: 1. No acute intracranial process. 2. 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 3. Left frontal lobe encephalomalacia consistent with prior postsurgical or chronic ischemic change. Electronically Signed   By: Sharlet Salina M.D.   On: 05/03/2022 20:56    EKG: Independently reviewed. LBBB new twave changes in  inferior leads  Assessment/Plan  Acute Impacted subcapital left femoral neck fracture s/p mechanical fall -noted ventral and varus angulation at the fracture site -admit to tele  -place on hip fracture protocol  -ortho consult Miller awaiting further recs -supportive with pain medications  -patient is cleared for surgery and is a low risk for low-intermediate surgery  3.9% 30 day risk of death, MI or cardiac arrest based on Cardiac risk index for pre-operative risk  Enlarging Ovarian Cyst -needs outpatient MRI for f/u    HTN -uncontrolled due to pain  -treat pain  -resume home regimen   Hypothyroidism  -continue synthroid     DVT prophylaxis: heparin Code Status: full Family  Communication:  Disposition Plan Consults called: [facility] Admission status: med tele   Lurline Del MD [facility] Pager [phone]  If 7PM-7AM, please contact night-coverage [email or web address] Password [facility]  05/03/2022, 9:43 PM

## 2022-05-03 NOTE — ED Triage Notes (Signed)
Pt arrived via EMS. Lives at home and was walking and fell down some stairs landing on concrete. Pt c/o severe L hip pain.

## 2022-05-03 NOTE — ED Notes (Signed)
Pt back at bedside and family is now present at bedside.

## 2022-05-03 NOTE — ED Notes (Signed)
Pt taken to radiology

## 2022-05-04 ENCOUNTER — Encounter: Payer: Self-pay | Admitting: Internal Medicine

## 2022-05-04 ENCOUNTER — Inpatient Hospital Stay: Payer: Medicare Other | Admitting: Certified Registered"

## 2022-05-04 ENCOUNTER — Other Ambulatory Visit: Payer: Self-pay

## 2022-05-04 ENCOUNTER — Encounter
Admission: EM | Disposition: A | Discharge status: Skilled Nursing Facility | Payer: Self-pay | Source: Home / Self Care | Attending: Internal Medicine

## 2022-05-04 ENCOUNTER — Inpatient Hospital Stay: Payer: Medicare Other

## 2022-05-04 DIAGNOSIS — S72009A Fracture of unspecified part of neck of unspecified femur, initial encounter for closed fracture: Secondary | ICD-10-CM | POA: Diagnosis present

## 2022-05-04 DIAGNOSIS — I10 Essential (primary) hypertension: Secondary | ICD-10-CM

## 2022-05-04 DIAGNOSIS — N83209 Unspecified ovarian cyst, unspecified side: Secondary | ICD-10-CM

## 2022-05-04 DIAGNOSIS — S72002A Fracture of unspecified part of neck of left femur, initial encounter for closed fracture: Secondary | ICD-10-CM | POA: Diagnosis not present

## 2022-05-04 HISTORY — PX: HIP ARTHROPLASTY: SHX981

## 2022-05-04 LAB — CBC
HCT: 39.5 % (ref 36.0–46.0)
Hemoglobin: 12.6 g/dL (ref 12.0–15.0)
MCH: 31 pg (ref 26.0–34.0)
MCHC: 31.9 g/dL (ref 30.0–36.0)
MCV: 97.3 fL (ref 80.0–100.0)
Platelets: 211 10*3/uL (ref 150–400)
RBC: 4.06 MIL/uL (ref 3.87–5.11)
RDW: 12.4 % (ref 11.5–15.5)
WBC: 11.3 10*3/uL — ABNORMAL HIGH (ref 4.0–10.5)
nRBC: 0 % (ref 0.0–0.2)

## 2022-05-04 LAB — URINALYSIS, ROUTINE W REFLEX MICROSCOPIC
Bilirubin Urine: NEGATIVE
Glucose, UA: NEGATIVE mg/dL
Hgb urine dipstick: NEGATIVE
Ketones, ur: NEGATIVE mg/dL
Leukocytes,Ua: NEGATIVE
Nitrite: NEGATIVE
Protein, ur: NEGATIVE mg/dL
Specific Gravity, Urine: 1.02 (ref 1.005–1.030)
pH: 5 (ref 5.0–8.0)

## 2022-05-04 LAB — BASIC METABOLIC PANEL
Anion gap: 6 (ref 5–15)
BUN: 21 mg/dL (ref 8–23)
CO2: 20 mmol/L — ABNORMAL LOW (ref 22–32)
Calcium: 8.2 mg/dL — ABNORMAL LOW (ref 8.9–10.3)
Chloride: 114 mmol/L — ABNORMAL HIGH (ref 98–111)
Creatinine, Ser: 0.85 mg/dL (ref 0.44–1.00)
GFR, Estimated: >60 mL/min (ref >60)
Glucose, Bld: 131 mg/dL — ABNORMAL HIGH (ref 70–99)
Potassium: 3.6 mmol/L (ref 3.5–5.1)
Sodium: 140 mmol/L (ref 135–145)

## 2022-05-04 LAB — TROPONIN I (HIGH SENSITIVITY)
Troponin I (High Sensitivity): 31 ng/L — ABNORMAL HIGH (ref <18)
Troponin I (High Sensitivity): 32 ng/L — ABNORMAL HIGH (ref <18)
Troponin I (High Sensitivity): 24 ng/L — ABNORMAL HIGH (ref <18)

## 2022-05-04 LAB — VITAMIN D 25 HYDROXY (VIT D DEFICIENCY, FRACTURES): Vit D, 25-Hydroxy: 102.47 ng/mL — ABNORMAL HIGH (ref 30–100)

## 2022-05-04 LAB — ABO/RH: ABO/RH(D): O NEG

## 2022-05-04 SURGERY — HEMIARTHROPLASTY, HIP, DIRECT ANTERIOR APPROACH, FOR FRACTURE
Anesthesia: Spinal | Site: Hip | Laterality: Left

## 2022-05-04 MED ORDER — TRANEXAMIC ACID-NACL 1000-0.7 MG/100ML-% IV SOLN
INTRAVENOUS | Status: AC
  Filled 2022-05-04: qty 100

## 2022-05-04 MED ORDER — STERILE WATER FOR IRRIGATION IR SOLN
Status: DC | PRN
  Administered 2022-05-04: 1000 mL

## 2022-05-04 MED ORDER — OXYCODONE HCL 5 MG/5ML PO SOLN: 5.0000 mg | Freq: Once | ORAL | Status: DC | PRN

## 2022-05-04 MED ORDER — ENOXAPARIN SODIUM 30 MG/0.3ML IJ SOSY
30.0000 mg | PREFILLED_SYRINGE | INTRAMUSCULAR | Status: DC
  Administered 2022-05-05 – 2022-05-06 (×2): 30 mg via SUBCUTANEOUS
  Filled 2022-05-04 (×2): qty 0.3

## 2022-05-04 MED ORDER — CEFAZOLIN SODIUM-DEXTROSE 2-4 GM/100ML-% IV SOLN
INTRAVENOUS | Status: AC
  Administered 2022-05-04: 2 g via INTRAVENOUS
  Filled 2022-05-04: qty 100

## 2022-05-04 MED ORDER — ONDANSETRON HCL 4 MG/2ML IJ SOLN
INTRAMUSCULAR | Status: AC
  Filled 2022-05-04: qty 2

## 2022-05-04 MED ORDER — FENTANYL CITRATE PF 50 MCG/ML IJ SOSY
25.0000 ug | PREFILLED_SYRINGE | Freq: Once | INTRAMUSCULAR | Status: AC
  Administered 2022-05-04: 25 ug via INTRAVENOUS

## 2022-05-04 MED ORDER — PROPOFOL 10 MG/ML IV BOLUS
INTRAVENOUS | Status: DC | PRN
  Administered 2022-05-04: 40 ug/kg/min via INTRAVENOUS

## 2022-05-04 MED ORDER — ACETAMINOPHEN 325 MG PO TABS: 325.0000 mg | ORAL_TABLET | Freq: Four times a day (QID) | ORAL | Status: DC | PRN

## 2022-05-04 MED ORDER — MORPHINE SULFATE (PF) 2 MG/ML IV SOLN
0.5000 mg | INTRAVENOUS | Status: DC | PRN
  Administered 2022-05-05 (×2): 1 mg via INTRAVENOUS
  Filled 2022-05-04 (×2): qty 1

## 2022-05-04 MED ORDER — FENTANYL CITRATE (PF) 100 MCG/2ML IJ SOLN
INTRAMUSCULAR | Status: AC
  Filled 2022-05-04: qty 2

## 2022-05-04 MED ORDER — METHOCARBAMOL 500 MG PO TABS
500.0000 mg | ORAL_TABLET | Freq: Four times a day (QID) | ORAL | Status: DC | PRN
  Administered 2022-05-04 – 2022-05-05 (×3): 500 mg via ORAL
  Filled 2022-05-04 (×3): qty 1

## 2022-05-04 MED ORDER — KETAMINE HCL 50 MG/5ML IJ SOSY
PREFILLED_SYRINGE | INTRAMUSCULAR | Status: AC
  Filled 2022-05-04: qty 5

## 2022-05-04 MED ORDER — ONDANSETRON HCL 4 MG/2ML IJ SOLN
4.0000 mg | Freq: Four times a day (QID) | INTRAMUSCULAR | Status: DC | PRN
  Administered 2022-05-06: 4 mg via INTRAVENOUS
  Filled 2022-05-04: qty 2

## 2022-05-04 MED ORDER — HYDROMORPHONE HCL 1 MG/ML IJ SOLN
0.5000 mg | INTRAMUSCULAR | Status: DC | PRN
  Administered 2022-05-04 (×2): 0.5 mg via INTRAVENOUS
  Filled 2022-05-04 (×3): qty 0.5

## 2022-05-04 MED ORDER — MIDAZOLAM HCL 2 MG/2ML IJ SOLN
INTRAMUSCULAR | Status: AC
  Filled 2022-05-04: qty 2

## 2022-05-04 MED ORDER — PROPOFOL 10 MG/ML IV BOLUS
INTRAVENOUS | Status: AC
  Filled 2022-05-04: qty 20

## 2022-05-04 MED ORDER — FENTANYL CITRATE PF 50 MCG/ML IJ SOSY
PREFILLED_SYRINGE | INTRAMUSCULAR | Status: AC
  Filled 2022-05-04: qty 1

## 2022-05-04 MED ORDER — LABETALOL HCL 5 MG/ML IV SOLN
10.0000 mg | INTRAVENOUS | Status: DC | PRN
Start: 2022-05-04 — End: 2022-05-07

## 2022-05-04 MED ORDER — SODIUM CHLORIDE 0.45 % IV SOLN
INTRAVENOUS | Status: DC
Start: 2022-05-04 — End: 2022-05-06

## 2022-05-04 MED ORDER — KETAMINE HCL 10 MG/ML IJ SOLN
INTRAMUSCULAR | Status: DC | PRN
  Administered 2022-05-04: 10 mg via INTRAVENOUS

## 2022-05-04 MED ORDER — ENSURE ENLIVE PO LIQD
237.0000 mL | Freq: Two times a day (BID) | ORAL | Status: DC
  Administered 2022-05-05 – 2022-05-07 (×4): 237 mL via ORAL
  Filled 2022-05-04 (×4): qty 237

## 2022-05-04 MED ORDER — ONDANSETRON HCL 4 MG/2ML IJ SOLN
INTRAMUSCULAR | Status: DC | PRN
  Administered 2022-05-04: 4 mg via INTRAVENOUS

## 2022-05-04 MED ORDER — ALUM & MAG HYDROXIDE-SIMETH 200-200-20 MG/5ML PO SUSP: 30.0000 mL | ORAL | Status: DC | PRN

## 2022-05-04 MED ORDER — CEFAZOLIN SODIUM-DEXTROSE 2-4 GM/100ML-% IV SOLN
2.0000 g | Freq: Three times a day (TID) | INTRAVENOUS | Status: AC
  Administered 2022-05-05 (×2): 2 g via INTRAVENOUS
  Filled 2022-05-04 (×3): qty 100

## 2022-05-04 MED ORDER — FENTANYL CITRATE (PF) 100 MCG/2ML IJ SOLN: 25.0000 ug | INTRAMUSCULAR | Status: DC | PRN

## 2022-05-04 MED ORDER — MENTHOL 3 MG MT LOZG: 1.0000 | LOZENGE | OROMUCOSAL | Status: DC | PRN

## 2022-05-04 MED ORDER — EPHEDRINE SULFATE (PRESSORS) 50 MG/ML IJ SOLN
INTRAMUSCULAR | Status: DC | PRN
  Administered 2022-05-04: 10 mg via INTRAVENOUS

## 2022-05-04 MED ORDER — ZOLPIDEM TARTRATE 5 MG PO TABS
5.0000 mg | ORAL_TABLET | Freq: Every evening | ORAL | Status: DC | PRN
Start: 2022-05-04 — End: 2022-05-06

## 2022-05-04 MED ORDER — FERROUS SULFATE 325 (65 FE) MG PO TABS
325.0000 mg | ORAL_TABLET | Freq: Every day | ORAL | Status: DC
  Administered 2022-05-05 – 2022-05-06 (×2): 325 mg via ORAL
  Filled 2022-05-04 (×2): qty 1

## 2022-05-04 MED ORDER — DOCUSATE SODIUM 100 MG PO CAPS: 100.0000 mg | ORAL_CAPSULE | Freq: Two times a day (BID) | ORAL | Status: DC

## 2022-05-04 MED ORDER — FLEET ENEMA 7-19 GM/118ML RE ENEM: 1.0000 | ENEMA | Freq: Once | RECTAL | Status: DC | PRN

## 2022-05-04 MED ORDER — BUPIVACAINE-EPINEPHRINE (PF) 0.25% -1:200000 IJ SOLN
INTRAMUSCULAR | Status: DC | PRN
  Administered 2022-05-04: 30 mL via PERINEURAL

## 2022-05-04 MED ORDER — METHOCARBAMOL 1000 MG/10ML IJ SOLN: 500.0000 mg | Freq: Four times a day (QID) | INTRAVENOUS | Status: DC | PRN

## 2022-05-04 MED ORDER — DEXAMETHASONE SODIUM PHOSPHATE 10 MG/ML IJ SOLN
INTRAMUSCULAR | Status: AC
  Filled 2022-05-04: qty 1

## 2022-05-04 MED ORDER — OXYCODONE HCL 5 MG PO TABS: 5.0000 mg | ORAL_TABLET | Freq: Once | ORAL | Status: DC | PRN

## 2022-05-04 MED ORDER — METOCLOPRAMIDE HCL 5 MG PO TABS: 5.0000 mg | ORAL_TABLET | Freq: Three times a day (TID) | ORAL | Status: DC | PRN

## 2022-05-04 MED ORDER — FENTANYL CITRATE (PF) 100 MCG/2ML IJ SOLN
INTRAMUSCULAR | Status: DC | PRN
  Administered 2022-05-04: 20 ug via INTRATHECAL

## 2022-05-04 MED ORDER — ADULT MULTIVITAMIN W/MINERALS CH
1.0000 | ORAL_TABLET | Freq: Every day | ORAL | Status: DC
  Administered 2022-05-05 – 2022-05-07 (×3): 1 via ORAL
  Filled 2022-05-04 (×4): qty 1

## 2022-05-04 MED ORDER — METOCLOPRAMIDE HCL 5 MG/ML IJ SOLN: 5.0000 mg | Freq: Three times a day (TID) | INTRAMUSCULAR | Status: DC | PRN

## 2022-05-04 MED ORDER — SODIUM CHLORIDE 0.9 % IR SOLN
Status: DC | PRN
  Administered 2022-05-04: 2008 mL

## 2022-05-04 MED ORDER — ONDANSETRON HCL 4 MG PO TABS: 4.0000 mg | ORAL_TABLET | Freq: Four times a day (QID) | ORAL | Status: DC | PRN

## 2022-05-04 MED ORDER — PHENOL 1.4 % MT LIQD: 1.0000 | OROMUCOSAL | Status: DC | PRN

## 2022-05-04 MED ORDER — PHENYLEPHRINE 80 MCG/ML (10ML) SYRINGE FOR IV PUSH (FOR BLOOD PRESSURE SUPPORT)
PREFILLED_SYRINGE | INTRAVENOUS | Status: DC | PRN
  Administered 2022-05-04: 160 ug via INTRAVENOUS
  Administered 2022-05-04: 80 ug via INTRAVENOUS
  Administered 2022-05-04: 160 ug via INTRAVENOUS

## 2022-05-04 MED ORDER — FENTANYL CITRATE (PF) 100 MCG/2ML IJ SOLN
INTRAMUSCULAR | Status: DC | PRN
  Administered 2022-05-04 (×2): 50 ug via INTRAVENOUS

## 2022-05-04 MED ORDER — BISACODYL 10 MG RE SUPP: 10.0000 mg | Freq: Every day | RECTAL | Status: DC | PRN

## 2022-05-04 MED ORDER — ONDANSETRON HCL 4 MG/2ML IJ SOLN
INTRAMUSCULAR | Status: AC
Start: 2022-05-04 — End: ?
  Filled 2022-05-04: qty 2

## 2022-05-04 MED ORDER — DEXAMETHASONE SODIUM PHOSPHATE 10 MG/ML IJ SOLN
INTRAMUSCULAR | Status: DC | PRN
  Administered 2022-05-04: 10 mg via INTRAVENOUS

## 2022-05-04 MED ORDER — TRANEXAMIC ACID-NACL 1000-0.7 MG/100ML-% IV SOLN
1000.0000 mg | Freq: Once | INTRAVENOUS | Status: AC
  Administered 2022-05-04: 1000 mg via INTRAVENOUS

## 2022-05-04 SURGICAL SUPPLY — 51 items
BLADE SAGITTAL WIDE XTHICK NO (BLADE) ×2 IMPLANT
CHLORAPREP W/TINT 26 (MISCELLANEOUS) ×4 IMPLANT
COVER BACK TABLE REUSABLE LG (DRAPES) ×2 IMPLANT
DRAPE INCISE IOBAN 66X60 STRL (DRAPES) ×4 IMPLANT
DRSG AQUACEL AG ADV 3.5X10 (GAUZE/BANDAGES/DRESSINGS) ×1 IMPLANT
ELECT CAUTERY BLADE 6.4 (BLADE) ×2 IMPLANT
ELECT REM PT RETURN 9FT ADLT (ELECTROSURGICAL) ×2
ELECTRODE REM PT RTRN 9FT ADLT (ELECTROSURGICAL) ×1 IMPLANT
GAUZE 4X4 16PLY ~~LOC~~+RFID DBL (SPONGE) ×2 IMPLANT
GAUZE SPONGE 4X4 12PLY STRL (GAUZE/BANDAGES/DRESSINGS) ×3 IMPLANT
GAUZE XEROFORM 1X8 LF (GAUZE/BANDAGES/DRESSINGS) ×3 IMPLANT
GLOVE SURG ORTHO 8.5 STRL (GLOVE) ×2 IMPLANT
GLOVE SURG UNDER LTX SZ8 (GLOVE) ×2 IMPLANT
GOWN STRL REUS W/ TWL LRG LVL3 (GOWN DISPOSABLE) ×2 IMPLANT
GOWN STRL REUS W/TWL LRG LVL3 (GOWN DISPOSABLE) ×2
GOWN STRL REUS W/TWL LRG LVL4 (GOWN DISPOSABLE) ×2 IMPLANT
HEAD MODULAR ENDO (Orthopedic Implant) ×1 IMPLANT
HEAD UNPLR 45XMDLR STRL HIP (Orthopedic Implant) IMPLANT
HEMOVAC 400CC 10FR (MISCELLANEOUS) ×2 IMPLANT
HOLSTER ELECTROSUGICAL PENCIL (MISCELLANEOUS) ×2 IMPLANT
IV NS 1000ML (IV SOLUTION) ×1
IV NS 1000ML BAXH (IV SOLUTION) ×1 IMPLANT
KIT TURNOVER KIT A (KITS) ×2 IMPLANT
MANIFOLD NEPTUNE II (INSTRUMENTS) ×2 IMPLANT
NDL FILTER BLUNT 18X1 1/2 (NEEDLE) ×1 IMPLANT
NDL SPNL 18GX3.5 QUINCKE PK (NEEDLE) ×2 IMPLANT
NEEDLE FILTER BLUNT 18X 1/2SAF (NEEDLE) ×1
NEEDLE FILTER BLUNT 18X1 1/2 (NEEDLE) ×1 IMPLANT
NEEDLE SPNL 18GX3.5 QUINCKE PK (NEEDLE) ×2 IMPLANT
NS IRRIG 1000ML POUR BTL (IV SOLUTION) ×2 IMPLANT
PACK HIP PROSTHESIS (MISCELLANEOUS) ×2 IMPLANT
PAD ABD DERMACEA PRESS 5X9 (GAUZE/BANDAGES/DRESSINGS) ×1 IMPLANT
PILLOW ABDUCTION FOAM SM (MISCELLANEOUS) ×1 IMPLANT
PULSAVAC PLUS IRRIG FAN TIP (DISPOSABLE) ×2
SLEEVE UNITRAX V40 STD (Orthopedic Implant) ×1 IMPLANT
SOL PREP PVP 2OZ (MISCELLANEOUS) ×2
SOLUTION PREP PVP 2OZ (MISCELLANEOUS) ×1 IMPLANT
SPONGE T-LAP 18X18 ~~LOC~~+RFID (SPONGE) ×8 IMPLANT
STAPLER SKIN PROX 35W (STAPLE) ×2 IMPLANT
STEM HIP 4 127DEG (Stem) ×1 IMPLANT
SUT DVC 2 QUILL PDO  T11 36X36 (SUTURE) ×1
SUT DVC 2 QUILL PDO T11 36X36 (SUTURE) ×2 IMPLANT
SUT QUILL PDO 0 36 36 VIOLET (SUTURE) ×2 IMPLANT
SUT TICRON 2-0 30IN 311381 (SUTURE) ×6 IMPLANT
SYR 10ML LL (SYRINGE) ×2 IMPLANT
SYR 30ML LL (SYRINGE) ×2 IMPLANT
SYR 50ML LL SCALE MARK (SYRINGE) ×2 IMPLANT
TAPE MICROFOAM 4IN (TAPE) ×2 IMPLANT
TIP FAN IRRIG PULSAVAC PLUS (DISPOSABLE) ×1 IMPLANT
TUBE SUCT KAM VAC (TUBING) ×2 IMPLANT
WATER STERILE IRR 1000ML POUR (IV SOLUTION) ×1 IMPLANT

## 2022-05-04 NOTE — Consult Note (Signed)
CARDIOLOGY CONSULT NOTE               Patient ID: LAYONNA DOBIE MRN: [id number] DOB/AGE: 1941-07-26 81 y.o.  Admit date: 05/03/2022 Referring Physician Ernest Haber, MD hospitalist Primary Physician Dr. Einar Crow primary Primary Cardiologist  Reason for Consultation preop clearance for hip surgery elevated troponins abnormal EKG  HPI: Patient is a 81 year old female history of hypertension hypothyroidism DJD states that she lost her balance and fell at home onto concrete injuring her left hip she was brought in by EMS patient was reasonably stable except for hip pain denied any blackout spells no lightheaded dizziness no palpitations or tachycardia received pain medications EKG at baseline had left bundle branch block normal sinus rhythm patient needed cardiac clearance prior to surgery so cardiology was consulted.  Patient had not had any significant cardiac evaluation in the past no significant cardiac history  Review of systems complete and found to be negative unless listed above     Past Medical History:  Diagnosis Date   Adnexal mass    Arthritis    Hard of hearing    Hypertension    Hypothyroidism    Pelvic pressure in female    Pelvic relaxation     Past Surgical History:  Procedure Laterality Date   CATARACT EXTRACTION W/PHACO Left 03/03/2022   Procedure: CATARACT EXTRACTION PHACO AND INTRAOCULAR LENS PLACEMENT (IOC) LEFT;  Surgeon: Galen Manila, MD;  Location: [facility];  Service: Ophthalmology;  Laterality: Left;  19.32 01:33.0   CATARACT EXTRACTION W/PHACO Right 03/17/2022   Procedure: CATARACT EXTRACTION PHACO AND INTRAOCULAR LENS PLACEMENT (IOC) RIGHT;  Surgeon: Galen Manila, MD;  Location: [facility];  Service: Ophthalmology;  Laterality: Right;  25.36 2:00.5   LEEP     ORTHOPEDIC SURGERY      (Not in a hospital admission)  Social History   Socioeconomic History   Marital status: Widowed    Spouse name: Not on  file   Number of children: Not on file   Years of education: Not on file   Highest education level: Not on file  Occupational History   Not on file  Tobacco Use   Smoking status: Never   Smokeless tobacco: Never  Vaping Use   Vaping Use: Never used  Substance and Sexual Activity   Alcohol use: No   Drug use: No   Sexual activity: Not Currently  Other Topics Concern   Not on file  Social History Narrative   Not on file   Social Determinants of Health   Financial Resource Strain: Not on file  Food Insecurity: Not on file  Transportation Needs: Not on file  Physical Activity: Not on file  Stress: Not on file  Social Connections: Not on file  Intimate Partner Violence: Not on file    Family History  Problem Relation Age of Onset   Parkinson's disease Father    Breast cancer Other       Review of systems complete and found to be negative unless listed above      PHYSICAL EXAM  General: Well developed, well nourished, in no acute distress HEENT:  Normocephalic and atramatic Neck:  No JVD.  Lungs: Clear bilaterally to auscultation and percussion. Heart: HRRR . Normal S1 and S2 without gallops or murmurs.  Abdomen: Bowel sounds are positive, abdomen soft and non-tender  Msk:  Back normal, normal gait. Normal strength and tone for age. Extremities: No clubbing, cyanosis or edema.  Left hip deformity injury Neuro:  Alert and oriented X 3. Psych:  Good affect, responds appropriately  Labs:   Lab Results  Component Value Date   WBC 11.3 (H) 05/04/2022   HGB 12.6 05/04/2022   HCT 39.5 05/04/2022   MCV 97.3 05/04/2022   PLT 211 05/04/2022    Recent Labs  Lab 05/03/22 2015 05/03/22 2239 05/04/22 0523  NA 138  --  140  K 3.8  --  3.6  CL 110  --  114*  CO2 21*  --  20*  BUN 31*  --  21  CREATININE 1.15*  --  0.85  CALCIUM 8.8*   < > 8.2*  PROT 6.3*  --   --   BILITOT 0.9  --   --   ALKPHOS 64  --   --   ALT 20  --   --   AST 16  --   --   GLUCOSE 153*   --  131*   < > = values in this interval not displayed.   No results found for: "CKTOTAL", "CKMB", "CKMBINDEX", "TROPONINI" No results found for: "CHOL" No results found for: "HDL" No results found for: "LDLCALC" No results found for: "TRIG" No results found for: "CHOLHDL" No results found for: "LDLDIRECT"    Radiology: DG Chest Portable 1 View  Result Date: 05/03/2022 CLINICAL DATA:  Preop.  Fall.  Left hip fracture. EXAM: PORTABLE CHEST 1 VIEW COMPARISON:  None Available. FINDINGS: Lung volumes are low. Borderline mild cardiomegaly likely accentuated by technique. Mild peribronchial thickening of unknown acuity. No pneumothorax or large pleural effusion. No acute rib fractures. IMPRESSION: 1. Borderline mild cardiomegaly likely accentuated by technique. 2. Low lung volumes.  Peribronchial thickening of unknown acuity. Electronically Signed   By: Narda Rutherford M.D.   On: 05/03/2022 21:36   CT PELVIS WO CONTRAST  Result Date: 05/03/2022 CLINICAL DATA:  Larey Seat downstairs, left hip pain EXAM: CT PELVIS WITHOUT CONTRAST TECHNIQUE: Multidetector CT imaging of the pelvis was performed following the standard protocol without intravenous contrast. RADIATION DOSE REDUCTION: This exam was performed according to the departmental dose-optimization program which includes automated exposure control, adjustment of the mA and/or kV according to patient size and/or use of iterative reconstruction technique. COMPARISON:  None Available. FINDINGS: Urinary Tract:  Distal ureters and bladder are grossly unremarkable. Bowel: No bowel obstruction or ileus. No bowel wall thickening or inflammatory change. Vascular/Lymphatic: Atherosclerosis of the aorta and its distal branches. No pathologic adenopathy. Reproductive: There is a 9.1 x 6.2 cm simple appearing left adnexal cyst again noted, previously having measured up to 7.5 cm. If not previously evaluated, and if the patient would be a therapy candidate should neoplasm be  detected, MRI follow-up may be useful. Uterus is atrophic. Right adnexa is unremarkable. Other: No free fluid or free intraperitoneal gas. No abdominal wall hernia. Musculoskeletal: There is an acute impacted subcapital left femoral neck fracture, with varus and ventral angulation at the fracture site. No dislocation. The remainder of the visualized bony pelvis is unremarkable. Symmetrical bilateral hip osteoarthritis. Reconstructed images demonstrate no additional findings. IMPRESSION: 1. Impacted subcapital left femoral neck fracture with ventral and varus angulation at the fracture site. 2. 9.1 cm simple appearing left adnexal cyst, increased since prior ultrasound 09/16/2020 where it had measured up to 7.5 cm. If not previously performed, and if the patient would be a therapy candidate should neoplasm be detected, nonemergent outpatient MRI follow-up or gynecologic surgical consultation may be useful. 3.  Aortic Atherosclerosis (ICD10-I70.0). Electronically Signed  By: Randa Ngo M.D.   On: 05/03/2022 21:02   CT HEAD WO CONTRAST (5MM)  Result Date: 05/03/2022 CLINICAL DATA:  [place] [address] down stairs, left hip pain EXAM: CT HEAD WITHOUT CONTRAST TECHNIQUE: Contiguous axial images were obtained from the base of the skull through the vertex without intravenous contrast. RADIATION DOSE REDUCTION: This exam was performed according to the departmental dose-optimization program which includes automated exposure control, adjustment of the mA and/or kV according to patient size and/or use of iterative reconstruction technique. COMPARISON:  None Available. FINDINGS: Brain: Left frontal lobe encephalomalacia consistent with prior infarct or chronic postsurgical change. Hypodensities throughout the periventricular white matter and basal ganglia most consistent with chronic small vessel ischemic changes. No other signs of acute infarct or hemorrhage. Lateral ventricles and remaining midline structures are unremarkable. No  acute extra-axial fluid collections. No mass effect. Vascular: No hyperdense vessel or unexpected calcification. Skull: Prior left temporal craniotomy.  No acute bony abnormality. Sinuses/Orbits: No acute finding. Other: None. IMPRESSION: 1. No acute intracranial process. 2. 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 3. Left frontal lobe encephalomalacia consistent with prior postsurgical or chronic ischemic change. Electronically Signed   By: Randa Ngo M.D.   On: 05/03/2022 20:56    EKG: Normal sinus rhythm left bundle branch block rate of 80  ASSESSMENT AND PLAN:  Preop fractured hip Hypertension Elevated troponins/probably demand ischemia Hypothyroidism Abnormal EKG . Plan Patient appears to be an acceptable surgical risk for hip surgery mild to moderate risk Do not recommend any further cardiac interventions to modify the risk Continue hypertension management and control Follow-up EKGs Consider echocardiogram prior to discharge Recommend conservative cardiac input at this stage  Signed: Yolonda Kida MD 05/04/2022, 12:37 PM

## 2022-05-04 NOTE — Progress Notes (Signed)
Initial Nutrition Assessment  DOCUMENTATION CODES:   Not applicable  INTERVENTION:   -Once diet is advanced, add:  -Ensure Enlive po BID, each supplement provides 350 kcal and 20 grams of protein -MVI with minerals daily  NUTRITION DIAGNOSIS:   Increased nutrient needs related to post-op healing as evidenced by estimated needs.  GOAL:   Patient will meet greater than or equal to 90% of their needs  MONITOR:   PO intake, Supplement acceptance, Diet advancement  REASON FOR ASSESSMENT:   Consult Assessment of nutrition requirement/status, Hip fracture protocol  ASSESSMENT:   Pt with medical history significant of   HTN, hypothyroidism ,arthritis Knee recent interim hx of cortisone shot  who had trip and fall down stairs landing on concrete with immediate severe left hip pain.  Pt admitted with acute impacted subcapital lt femoral neck fracture s/p mechanical fall.   Pt unavailable at time of visit. RD unable to obtain further nutrition-related history or complete nutrition-focused physical exam at this time.   Per orthopedics notes, pt is NPO for lt hemiarthroplasty today.    Reviewed wt hx; no wt loss noted over the past 2 months.   Pt with increased nutritional needs for post-operative healing and would benefit from addition of oral nutrition supplements.   Medications reviewed and include colace, vitamin D, and 0.9% sodium chloride infusion @ 75 ml/hr.   Labs reviewed.    Diet Order:   Diet Order             Diet NPO time specified  Diet effective midnight                   EDUCATION NEEDS:   No education needs have been identified at this time  Skin:  Skin Assessment: Skin Integrity Issues: Skin Integrity Issues:: Incisions Incisions: closed lt hip  Last BM:  Unknown  Height:   Ht Readings from Last 1 Encounters:  05/04/22 5\' 4"  (1.626 m)    Weight:   Wt Readings from Last 1 Encounters:  05/04/22 63.5 kg    Ideal Body Weight:  54.5  kg  BMI:  Body mass index is 24.03 kg/m.  Estimated Nutritional Needs:   Kcal:  1600-1800  Protein:  80-95 grams  Fluid:  > 1.6 L    05/06/22, RD, LDN, CDCES Registered Dietitian II Certified Diabetes Care and Education Specialist Please refer to [facility] for RD and/or RD on-call/weekend/after hours pager

## 2022-05-04 NOTE — H&P (Signed)
THE PATIENT WAS SEEN PRIOR TO SURGERY TODAY.  HISTORY, ALLERGIES, HOME MEDICATIONS AND OPERATIVE PROCEDURE WERE REVIEWED. RISKS AND BENEFITS OF SURGERY DISCUSSED WITH PATIENT AGAIN.  NO CHANGES FROM INITIAL HISTORY AND PHYSICAL NOTED.    

## 2022-05-04 NOTE — Progress Notes (Signed)
PT Cancellation Note  Patient Details Name: Krystal Sanchez MRN: [id number] DOB: November 17, 1940   Cancelled Treatment:    Reason Eval/Treat Not Completed: Medical issues which prohibited therapy (Consult received and chart reviewed.  Patient noted with acute fracture to L femur; pending surgical repair. Per guidelines, will require new PT/OT orders post-surgery.  Will complete initial order; please reconsult post-repair as medically appropriate.)   Maliah Pyles H. Manson Passey, PT, DPT, NCS 05/04/22, 7:17 AM [phone]

## 2022-05-04 NOTE — Op Note (Signed)
05/04/2022  3:58 PM  PATIENT:  Krystal Sanchez   MRN: [id number]  PRE-OPERATIVE DIAGNOSIS:  Displaced Subcapital fracture left hip   POST-OPERATIVE DIAGNOSIS: Same  PROCEDURE: Left   hip hemiarthroplasty with Stryker Accolade prosthesis  Assistant: Danae Orleans PA-C  PREOPERATIVE INDICATIONS:  Krystal Sanchez is an 81 y.o. female who was admitted 05/03/2022 with a diagnosis of displaced subcapital fracture of the hip and elected for surgical management.  The risks benefits and alternatives were discussed with the patient including but not limited to the risks of nonoperative treatment, versus surgical intervention including infection, bleeding, nerve injury, periprosthetic fracture, the need for revision surgery, dislocation, leg length discrepancy, blood clots, cardiopulmonary complications, morbidity, mortality, among others, and they were willing to proceed.  Predicted outcome is good, although there will be at least a six to nine month expected recovery.     SURGEON:  Earnestine Leys, MD  ASST:    ANESTHESIA: General endotracheal    COMPLICATIONS:  None.   EBL: 200 cc    COMPONENTS:  Stryker Accolade Femoral Fracture stem size #4,   and a size   45 mm  fracture head unipolar hip ball with    standard mm  neck length.    PROCEDURE IN DETAIL: The patient was met in the holding area and identified.  The appropriate hip  was marked at the operative site. The patient was then transported to the OR and  placed under general anesthesia.  At that point, the patient was  placed in the lateral decubitus position with the operative side up and  secured to the operating room table and all bony prominences padded.     The operative lower extremity was prepped from the iliac crest to the toes.  Sterile draping was performed.  Time out was performed prior to incision.      A routine posterolateral approach was utilized via sharp dissection  carried down to the subcutaneous tissue.  Gross bleeders  were Bovie  coagulated.  The iliotibial band was identified and incised  along the length of the skin incision.  Self-retaining retractors were  inserted.  With the hip internally rotated, the short external rotators  were identified. The piriformis was tagged and the hip capsule released in a T-type fashion.  The femoral neck was exposed, and I resected the femoral neck using the appropriate jig. This was performed at approximately a thumb's breadth above the lesser trochanter.    I then exposed the deep acetabulum, cleared out any tissue including the ligamentum teres.    I then prepared the proximal femur using the cookie-cutter, the lateralizing reamer, and then sequentially broached.  A trial stem   was  utilized along with a unipolar head and neck.  I reduced the hip and it was found to have excellent stability with functional range of motion. Leg lengths were equal.  The trial components were then removed.   The same size Accolade femoral stem was then inserted and was very stable.  The Unitrax head and neck as trialed were inserted as well.     The hip was then reduced and taken through functional range of motion and found to have excellent stability. Leg lengths were restored.     I closed the T in the capsule with #2 Ticron as well as the short external rotators. A hemovac was inserted.    I then irrigated the hip copiously again with pulse lavage, and repaired the fascia with #2 Quill and the  subcutaneous layer with #0 Quill. Sponge and needle counts were correct. Dry sterile Aquacell was applied.   The patient was then awakened and returned to PACU in stable and satisfactory condition. There were no complications.  Park Breed, MD Orthopedic Surgeon [phone]   05/04/2022 3:58 PM

## 2022-05-04 NOTE — Consult Note (Signed)
ORTHOPAEDIC CONSULTATION  REQUESTING PHYSICIAN: Charise Killian, MD  Chief Complaint: Left hip pain  HPI: Krystal Sanchez is a 81 y.o. female who complains of left hip pain after a fall at home last night.  The patient lives alone.  She was able to call her daughter and EMS brought her to the emergency room where exam and x-rays and CT scan show a displaced subcapital fracture of the left hip.  She has been cleared by the medical service for surgery.  I have discussed treatment with the patient and her daughter.  I have recommended hemiarthroplasty as the treatment most likely to give her good results.  The risks and benefits of surgery were discussed.  They did wish to proceed and we will start shortly.  Past Medical History:  Diagnosis Date   Adnexal mass    Arthritis    Hard of hearing    Hypertension    Hypothyroidism    Pelvic pressure in female    Pelvic relaxation    Past Surgical History:  Procedure Laterality Date   CATARACT EXTRACTION W/PHACO Left 03/03/2022   Procedure: CATARACT EXTRACTION PHACO AND INTRAOCULAR LENS PLACEMENT (IOC) LEFT;  Surgeon: Galen Manila, MD;  Location: [facility];  Service: Ophthalmology;  Laterality: Left;  19.32 01:33.0   CATARACT EXTRACTION W/PHACO Right 03/17/2022   Procedure: CATARACT EXTRACTION PHACO AND INTRAOCULAR LENS PLACEMENT (IOC) RIGHT;  Surgeon: Galen Manila, MD;  Location: [facility];  Service: Ophthalmology;  Laterality: Right;  25.36 2:00.5   LEEP     ORTHOPEDIC SURGERY     Social History   Socioeconomic History   Marital status: Widowed    Spouse name: Not on file   Number of children: Not on file   Years of education: Not on file   Highest education level: Not on file  Occupational History   Not on file  Tobacco Use   Smoking status: Never   Smokeless tobacco: Never  Vaping Use   Vaping Use: Never used  Substance and Sexual Activity   Alcohol use: No   Drug use: No   Sexual activity:  Not Currently  Other Topics Concern   Not on file  Social History Narrative   Not on file   Social Determinants of Health   Financial Resource Strain: Not on file  Food Insecurity: Not on file  Transportation Needs: Not on file  Physical Activity: Not on file  Stress: Not on file  Social Connections: Not on file   Family History  Problem Relation Age of Onset   Parkinson's disease Father    Breast cancer Other    No Known Allergies Prior to Admission medications   Medication Sig Start Date End Date Taking? Authorizing Provider  atorvastatin (LIPITOR) 40 MG tablet Take 40 mg by mouth daily. 08/30/17  Yes [provider]  levothyroxine (SYNTHROID) 75 MCG tablet Take 75 mcg by mouth every morning. 03/21/22  Yes [provider]  losartan-hydrochlorothiazide (HYZAAR) 100-12.5 MG tablet Take 1 tablet by mouth daily. 09/18/17  Yes [provider]  omeprazole (PRILOSEC) 20 MG capsule Take 20 mg by mouth daily as needed.   Yes [provider]  potassium chloride (KLOR-CON) 10 MEQ tablet Take 10 mEq by mouth daily. 03/05/22  Yes [provider]  Vitamin D, Ergocalciferol, (DRISDOL) 50000 units CAPS capsule TAKE 1 CAPSULE BY MOUTH ONCE A WEEK AS DIRECTED 09/18/17  Yes [provider]   DG Chest Portable 1 View  Result Date: 05/03/2022  CLINICAL DATA:  Preop.  Fall.  Left hip fracture. EXAM: PORTABLE CHEST 1 VIEW COMPARISON:  None Available. FINDINGS: Lung volumes are low. Borderline mild cardiomegaly likely accentuated by technique. Mild peribronchial thickening of unknown acuity. No pneumothorax or large pleural effusion. No acute rib fractures. IMPRESSION: 1. Borderline mild cardiomegaly likely accentuated by technique. 2. Low lung volumes.  Peribronchial thickening of unknown acuity. Electronically Signed   By: Narda Rutherford M.D.   On: 05/03/2022 21:36   CT PELVIS WO CONTRAST  Result Date: 05/03/2022 CLINICAL DATA:  Larey Seat downstairs, left  hip pain EXAM: CT PELVIS WITHOUT CONTRAST TECHNIQUE: Multidetector CT imaging of the pelvis was performed following the standard protocol without intravenous contrast. RADIATION DOSE REDUCTION: This exam was performed according to the departmental dose-optimization program which includes automated exposure control, adjustment of the mA and/or kV according to patient size and/or use of iterative reconstruction technique. COMPARISON:  None Available. FINDINGS: Urinary Tract:  Distal ureters and bladder are grossly unremarkable. Bowel: No bowel obstruction or ileus. No bowel wall thickening or inflammatory change. Vascular/Lymphatic: Atherosclerosis of the aorta and its distal branches. No pathologic adenopathy. Reproductive: There is a 9.1 x 6.2 cm simple appearing left adnexal cyst again noted, previously having measured up to 7.5 cm. If not previously evaluated, and if the patient would be a therapy candidate should neoplasm be detected, MRI follow-up may be useful. Uterus is atrophic. Right adnexa is unremarkable. Other: No free fluid or free intraperitoneal gas. No abdominal wall hernia. Musculoskeletal: There is an acute impacted subcapital left femoral neck fracture, with varus and ventral angulation at the fracture site. No dislocation. The remainder of the visualized bony pelvis is unremarkable. Symmetrical bilateral hip osteoarthritis. Reconstructed images demonstrate no additional findings. IMPRESSION: 1. Impacted subcapital left femoral neck fracture with ventral and varus angulation at the fracture site. 2. 9.1 cm simple appearing left adnexal cyst, increased since prior ultrasound 09/16/2020 where it had measured up to 7.5 cm. If not previously performed, and if the patient would be a therapy candidate should neoplasm be detected, nonemergent outpatient MRI follow-up or gynecologic surgical consultation may be useful. 3.  Aortic Atherosclerosis (ICD10-I70.0). Electronically Signed   By: Sharlet Salina  M.D.   On: 05/03/2022 21:02   CT HEAD WO CONTRAST ( )  Result Date: 05/03/2022 CLINICAL DATA:  Larey Seat down stairs, left hip pain EXAM: CT HEAD WITHOUT CONTRAST TECHNIQUE: Contiguous axial images were obtained from the base of the skull through the vertex without intravenous contrast. RADIATION DOSE REDUCTION: This exam was performed according to the departmental dose-optimization program which includes automated exposure control, adjustment of the mA and/or kV according to patient size and/or use of iterative reconstruction technique. COMPARISON:  None Available. FINDINGS: Brain: Left frontal lobe encephalomalacia consistent with prior infarct or chronic postsurgical change. Hypodensities throughout the periventricular white matter and basal ganglia most consistent with chronic small vessel ischemic changes. No other signs of acute infarct or hemorrhage. Lateral ventricles and remaining midline structures are unremarkable. No acute extra-axial fluid collections. No mass effect. Vascular: No hyperdense vessel or unexpected calcification. Skull: Prior left temporal craniotomy.  No acute bony abnormality. Sinuses/Orbits: No acute finding. Other: None. IMPRESSION: 1. No acute intracranial process. 2. 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 3. Left frontal lobe encephalomalacia consistent with prior postsurgical or chronic ischemic change. Electronically Signed   By: Sharlet Salina M.D.   On: 05/03/2022 20:56    Positive ROS: All other systems have been reviewed and  were otherwise negative with the exception of those mentioned in the HPI and as above.  Physical Exam: General: Alert, no acute distress Cardiovascular: No pedal edema Respiratory: No cyanosis, no use of accessory musculature GI: No organomegaly, abdomen is soft and non-tender Skin: No lesions in the area of chief complaint Neurologic: Sensation intact distally Psychiatric: Patient is competent for  consent with normal mood and affect Lymphatic: No axillary or cervical lymphadenopathy  MUSCULOSKELETAL: Thin female lying quietly on a stretcher.  Her daughter is present.  The left leg is shortened and rotated.  There is severe pain with any attempts at movement.  The skin is intact.  Neurovascular status is good distally.  The right lower extremity is normal to exam.  Both upper extremities are normal except for small abrasion on the left elbow.  Head neck and spine are normal    Assessment: Displaced subcapital fracture left hip  Plan: Left hip hemiarthroplasty    Valinda Hoar, MD [phone]   05/04/2022 1:09 PM

## 2022-05-04 NOTE — Progress Notes (Signed)
PROGRESS NOTE    Krystal Sanchez  UQJ:[id number] DOB: July 15, 1941 DOA: 05/03/2022 PCP: Lauro Regulus, MD   Assessment & Plan:   Principal Problem:   Hip fx [facility])  Assessment and Plan: Acute Impacted subcapital left femoral neck fracture: s/p mechanical fall. Dilaudid prn for pain. Ortho surg consulted.     Elevated troponins: minimal. Likely secondary to demand ischemia. Echo ordered. No further cardiac inpatient work-up recommended as per cardio    Enlarging Ovarian Cyst: will need MRI outpatient and possible gyn surg f/u    HTN: continue on losartan, HCTZ   Hypothyroidism: continue on home dose of synthroid        DVT prophylaxis: SCDs Code Status: full  Family Communication: discussed pt's care w/ pt's family at bedside and answered their questions  Disposition Plan:  will consult PT/OT after surgery   Level of care: Telemetry Cardiac  Status is: Inpatient Remains inpatient appropriate because: severity of illness   Consultants:  Ortho surg Cardio   Procedures:   Antimicrobials:    Subjective: Pt c/o left hip pain   Objective: Vitals:   05/04/22 0400 05/04/22 0500 05/04/22 0600 05/04/22 0700  BP: (!) 124/49 (!) 141/57 (!) 117/106 (!) 127/56  Pulse: 86 91 82 80  Resp: 14 20 16 17   Temp:      TempSrc:      SpO2: 96% 95% 96% 94%  Weight:      Height:       No intake or output data in the 24 hours ending 05/04/22 0800 Filed Weights   05/03/22 1957  Weight: 63.5 kg    Examination:  General exam: Appears calm but uncomfortable  Respiratory system: Clear to auscultation. Respiratory effort normal. Cardiovascular system: S1 & S2+. No rubs, gallops or clicks.  Gastrointestinal system: Abdomen is nondistended, soft and nontender. Normal bowel sounds heard. Central nervous system: Alert and oriented. Moves all extremities Extremities: LLE is shorten & externally rotated.  Psychiatry: Judgement and insight appear normal. Mood & affect  appropriate.     Data Reviewed: I have personally reviewed following labs and imaging studies  CBC: Recent Labs  Lab 05/03/22 2015 05/04/22 0523  WBC 18.3* 11.3*  HGB 13.0 12.6  HCT 40.3 39.5  MCV 96.4 97.3  PLT 264 211   Basic Metabolic Panel: Recent Labs  Lab 05/03/22 2015 05/03/22 2239 05/04/22 0523  NA 138  --  140  K 3.8  --  3.6  CL 110  --  114*  CO2 21*  --  20*  GLUCOSE 153*  --  131*  BUN 31*  --  21  CREATININE 1.15*  --  0.85  CALCIUM 8.8* 8.3* 8.2*   GFR: Estimated Creatinine Clearance: 44.8 mL/min (by C-G formula based on SCr of 0.85 mg/dL). Liver Function Tests: Recent Labs  Lab 05/03/22 2015  AST 16  ALT 20  ALKPHOS 64  BILITOT 0.9  PROT 6.3*  ALBUMIN 3.9   No results for input(s): "LIPASE", "AMYLASE" in the last 168 hours. No results for input(s): "AMMONIA" in the last 168 hours. Coagulation Profile: Recent Labs  Lab 05/03/22 2239  INR 1.1   Cardiac Enzymes: No results for input(s): "CKTOTAL", "CKMB", "CKMBINDEX", "TROPONINI" in the last 168 hours. BNP (last 3 results) No results for input(s): "PROBNP" in the last 8760 hours. HbA1C: No results for input(s): "HGBA1C" in the last 72 hours. CBG: No results for input(s): "GLUCAP" in the last 168 hours. Lipid Profile: No results for input(s): "CHOL", "HDL", "  LDLCALC", "TRIG", "CHOLHDL", "LDLDIRECT" in the last 72 hours. Thyroid Function Tests: No results for input(s): "TSH", "T4TOTAL", "FREET4", "T3FREE", "THYROIDAB" in the last 72 hours. Anemia Panel: No results for input(s): "VITAMINB12", "FOLATE", "FERRITIN", "TIBC", "IRON", "RETICCTPCT" in the last 72 hours. Sepsis Labs: No results for input(s): "PROCALCITON", "LATICACIDVEN" in the last 168 hours.  No results found for this or any previous visit (from the past 240 hour(s)).       Radiology Studies: DG Chest Portable 1 View  Result Date: 05/03/2022 CLINICAL DATA:  Preop.  Fall.  Left hip fracture. EXAM: PORTABLE CHEST 1  VIEW COMPARISON:  None Available. FINDINGS: Lung volumes are low. Borderline mild cardiomegaly likely accentuated by technique. Mild peribronchial thickening of unknown acuity. No pneumothorax or large pleural effusion. No acute rib fractures. IMPRESSION: 1. Borderline mild cardiomegaly likely accentuated by technique. 2. Low lung volumes.  Peribronchial thickening of unknown acuity. Electronically Signed   By: Narda Rutherford M.D.   On: 05/03/2022 21:36   CT PELVIS WO CONTRAST  Result Date: 05/03/2022 CLINICAL DATA:  Larey Seat downstairs, left hip pain EXAM: CT PELVIS WITHOUT CONTRAST TECHNIQUE: Multidetector CT imaging of the pelvis was performed following the standard protocol without intravenous contrast. RADIATION DOSE REDUCTION: This exam was performed according to the departmental dose-optimization program which includes automated exposure control, adjustment of the mA and/or kV according to patient size and/or use of iterative reconstruction technique. COMPARISON:  None Available. FINDINGS: Urinary Tract:  Distal ureters and bladder are grossly unremarkable. Bowel: No bowel obstruction or ileus. No bowel wall thickening or inflammatory change. Vascular/Lymphatic: Atherosclerosis of the aorta and its distal branches. No pathologic adenopathy. Reproductive: There is a 9.1 x 6.2 cm simple appearing left adnexal cyst again noted, previously having measured up to 7.5 cm. If not previously evaluated, and if the patient would be a therapy candidate should neoplasm be detected, MRI follow-up may be useful. Uterus is atrophic. Right adnexa is unremarkable. Other: No free fluid or free intraperitoneal gas. No abdominal wall hernia. Musculoskeletal: There is an acute impacted subcapital left femoral neck fracture, with varus and ventral angulation at the fracture site. No dislocation. The remainder of the visualized bony pelvis is unremarkable. Symmetrical bilateral hip osteoarthritis. Reconstructed images demonstrate  no additional findings. IMPRESSION: 1. Impacted subcapital left femoral neck fracture with ventral and varus angulation at the fracture site. 2. 9.1 cm simple appearing left adnexal cyst, increased since prior ultrasound 09/16/2020 where it had measured up to 7.5 cm. If not previously performed, and if the patient would be a therapy candidate should neoplasm be detected, nonemergent outpatient MRI follow-up or gynecologic surgical consultation may be useful. 3.  Aortic Atherosclerosis (ICD10-I70.0). Electronically Signed   By: Sharlet Salina M.D.   On: 05/03/2022 21:02   CT HEAD WO CONTRAST ( )  Result Date: 05/03/2022 CLINICAL DATA:  Larey Seat down stairs, left hip pain EXAM: CT HEAD WITHOUT CONTRAST TECHNIQUE: Contiguous axial images were obtained from the base of the skull through the vertex without intravenous contrast. RADIATION DOSE REDUCTION: This exam was performed according to the departmental dose-optimization program which includes automated exposure control, adjustment of the mA and/or kV according to patient size and/or use of iterative reconstruction technique. COMPARISON:  None Available. FINDINGS: Brain: Left frontal lobe encephalomalacia consistent with prior infarct or chronic postsurgical change. Hypodensities throughout the periventricular white matter and basal ganglia most consistent with chronic small vessel ischemic changes. No other signs of acute infarct or hemorrhage. Lateral ventricles and remaining midline structures are unremarkable.  No acute extra-axial fluid collections. No mass effect. Vascular: No hyperdense vessel or unexpected calcification. Skull: Prior left temporal craniotomy.  No acute bony abnormality. Sinuses/Orbits: No acute finding. Other: None. IMPRESSION: 1. No acute intracranial process. 2. 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 3. Left frontal lobe encephalomalacia consistent with prior postsurgical or chronic ischemic  change. Electronically Signed   By: Sharlet Salina M.D.   On: 05/03/2022 20:56        Scheduled Meds:  atorvastatin  40 mg Oral q1800   chlorhexidine   Topical Once   docusate sodium  100 mg Oral BID   losartan  100 mg Oral Daily   And   hydrochlorothiazide  12.5 mg Oral Daily   levothyroxine  75 mcg Oral Q0600   Vitamin D (Ergocalciferol)  50,000 Units Oral Q7 days   Continuous Infusions:  sodium chloride 75 mL/hr at 05/03/22 2257    ceFAZolin (ANCEF) IV     tranexamic acid       LOS: 1 day    Time spent: 35 mins     Charise Killian, MD [facility] Pager [phone]  If 7PM-7AM, please contact night-coverage [email or web address] 05/04/2022, 8:00 AM

## 2022-05-04 NOTE — Progress Notes (Signed)
OT Cancellation Note  Patient Details Name: Krystal Sanchez MRN: [id number] DOB: 1941-10-18   Cancelled Treatment:    Reason Eval/Treat Not Completed: Medical issues which prohibited therapy.  Patient noted with acute fracture to L femur; pending surgical repair today. Per guidelines, will require new PT/OT orders post-surgery.  Will complete initial order; please reconsult post-repair as medically appropriate  Jackquline Denmark, MS, OTR/L , CBIS ascom [phone]  05/04/22, 9:18 AM

## 2022-05-04 NOTE — Transfer of Care (Signed)
Immediate Anesthesia Transfer of Care Note  Patient: Krystal Sanchez  Procedure(s) Performed: ARTHROPLASTY BIPOLAR HIP (HEMIARTHROPLASTY) (Left: Hip)  Patient Location: PACU  Anesthesia Type:General and Spinal  Level of Consciousness: awake, alert  and oriented  Airway & Oxygen Therapy: Patient Spontanous Breathing and Patient connected to nasal cannula oxygen  Post-op Assessment: Report given to RN and Post -op Vital signs reviewed and stable  Post vital signs: Reviewed and stable  Last Vitals:  Vitals Value Taken Time  BP 120/46 05/04/22 1530  Temp 35.9 C 05/04/22 1530  Pulse 73 05/04/22 1530  Resp 15 05/04/22 1535  SpO2 95 % 05/04/22 1530  Vitals shown include unvalidated device data.  Last Pain:  Vitals:   05/04/22 1530  TempSrc:   PainSc: 0-No pain         Complications: No notable events documented.

## 2022-05-04 NOTE — ED Notes (Signed)
Maisie Fus MD Provider at bedside.

## 2022-05-04 NOTE — Anesthesia Procedure Notes (Signed)
Spinal  Patient location during procedure: OR Start time: 05/04/2022 1:45 PM End time: 05/04/2022 2:00 PM Reason for block: surgical anesthesia Staffing Performed: anesthesiologist  Anesthesiologist: Dimas Millin, MD Performed by: Dimas Millin, MD Authorized by: Iran Ouch, MD   Preanesthetic Checklist Completed: patient identified, IV checked, site marked, risks and benefits discussed, surgical consent, monitors and equipment checked, pre-op evaluation and timeout performed Spinal Block Patient position: left lateral decubitus Prep: ChloraPrep and site prepped and draped Patient monitoring: heart rate, continuous pulse ox, blood pressure and cardiac monitor Approach: midline Location: L3-4 Injection technique: single-shot Needle Needle type: Whitacre and Introducer  Needle gauge: 24 G Needle length: 9 cm Assessment Sensory level: T10 Events: CSF return Additional Notes Meticulous sterile technique used throughout (CHG prep, sterile gloves, sterile drape). Negative paresthesia. Negative blood return. Positive free-flowing CSF. 1.5cc of 0.75% bupivacine and 58mg of fentanyl injected. Expiration date of kit checked and confirmed. Patient tolerated procedure well, without complications.

## 2022-05-04 NOTE — Anesthesia Preprocedure Evaluation (Addendum)
Anesthesia Evaluation  Patient identified by MRN, date of birth, ID band Patient awake    Reviewed: Allergy & Precautions, NPO status , Patient's Chart, lab work & pertinent test results  Airway Mallampati: III  TM Distance: >3 FB Neck ROM: full    Dental  (+) Chipped   Pulmonary neg pulmonary ROS,    Pulmonary exam normal        Cardiovascular hypertension, Pt. on medications negative cardio ROS Normal cardiovascular exam  Elevated troponins: minimal. Likely secondary to demand ischemia. Echo ordered    Neuro/Psych negative neurological ROS  negative psych ROS   GI/Hepatic negative GI ROS, Neg liver ROS,   Endo/Other  negative endocrine ROSHypothyroidism   Renal/GU      Musculoskeletal   Abdominal   Peds  Hematology negative hematology ROS (+)   Anesthesia Other Findings Left Hip fracture. 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 Borderline mild cardiomegaly likely accentuated by technique. 2. Low lung volumes.  Peribronchial thickening of unknown acuity.  CT HEAD: IMPRESSION: 1. No acute intracranial process. 2. 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 3. Left frontal lobe encephalomalacia consistent with prior postsurgical or chronic ischemic change.  Enlarging Ovarian Cyst  Past Medical History: No date: Adnexal mass No date: Arthritis No date: Hard of hearing No date: Hypertension No date: Hypothyroidism No date: Pelvic pressure in female No date: Pelvic relaxation  Past Surgical History: 03/03/2022: CATARACT EXTRACTION W/PHACO; Left     Comment:  Procedure: CATARACT EXTRACTION PHACO AND INTRAOCULAR               LENS PLACEMENT (IOC) LEFT;  Surgeon: Galen Manila,               MD;  Location: [facility];   Service:               Ophthalmology;  Laterality: Left;  19.32 01:33.0 03/17/2022: CATARACT EXTRACTION W/PHACO; Right     Comment:  Procedure: CATARACT EXTRACTION PHACO AND INTRAOCULAR               LENS PLACEMENT (IOC) RIGHT;  Surgeon: Galen Manila,               MD;  Location: [facility];  Service:               Ophthalmology;  Laterality: Right;  25.36 2:00.5 No date: LEEP No date: ORTHOPEDIC SURGERY  BMI    Body Mass Index: 24.03 kg/m      Reproductive/Obstetrics negative OB ROS                           Anesthesia Physical Anesthesia Plan  ASA: 3  Anesthesia Plan: Spinal   Post-op Pain Management:    Induction:   PONV Risk Score and Plan: 2  Airway Management Planned: Natural Airway and Nasal Cannula  Additional Equipment:   Intra-op Plan:   Post-operative Plan:   Informed Consent: I have reviewed the patients History and Physical, chart, labs and discussed the procedure including the risks, benefits and alternatives for the proposed anesthesia with the patient or authorized representative who has indicated his/her understanding and acceptance.     Dental Advisory Given  Plan Discussed with: Anesthesiologist, CRNA and Surgeon  Anesthesia Plan Comments: (Patient reports no  bleeding problems and no anticoagulant use.  Plan for spinal with backup GA  Patient consented for risks of anesthesia including but not limited to:  - adverse reactions to medications - damage to eyes, teeth, lips or other oral mucosa - nerve damage due to positioning  - risk of bleeding, infection and or nerve damage from spinal that could lead to paralysis - risk of headache or failed spinal - damage to teeth, lips or other oral mucosa - sore throat or hoarseness - damage to heart, brain, nerves, lungs, other parts of body or loss of life  Patient voiced understanding.)       Anesthesia Quick Evaluation

## 2022-05-04 NOTE — TOC Initial Note (Signed)
Transition of Care [facility]) - Initial/Assessment Note    Patient Details  Name: Krystal Sanchez MRN: [id number] Date of Birth: 15-Jul-1941  Transition of Care [facility]) CM/SW Contact:    Alberteen Sam, LCSW Phone Number: 05/04/2022, 11:07 AM  Clinical Narrative:                  CSW met with patient and her daughters Almyra Free and Claiborne Billings at bedside. They report patient is coming from home, lives home alone and is independent at baseline. PCP is Dr. Ouida Sills. Patient has a rolling walker and a cane at home however rarely uses them. Daughters report they live 3 min from patient and are able to assist with support as needed.   CSW  informed them depending on hospital medical course following surgery, patient may be recommended HH vs SNF. They report they would be agreeable to what was best for the patient but ideally would like to work towards her going home with [facility]. They have not used HH in the past, CSW explained services.   Pending medical course and therapy recs for appropriate discharge venue.  Expected Discharge Plan:  (TBD) Barriers to Discharge: Continued Medical Work up   Patient Goals and CMS Choice Patient states their goals for this hospitalization and ongoing recovery are:: to go home CMS Medicare.gov Compare Post Acute Care list provided to:: Patient Choice offered to / list presented to : Patient  Expected Discharge Plan and Services Expected Discharge Plan:  (TBD)       Living arrangements for the past 2 months: Single Family Home                                      Prior Living Arrangements/Services Living arrangements for the past 2 months: Single Family Home Lives with:: Self                   Activities of Daily Living      Permission Sought/Granted                  Emotional Assessment       Orientation: : Oriented to Self, Oriented to Place, Oriented to  Time, Oriented to Situation      Admission diagnosis:  Hip fx ([place])  [S72.009A] Hip fracture ([place]) [S72.009A] Patient Active Problem List   Diagnosis Date Noted   Hip fracture ([place]) 05/04/2022   Hip fx ([place]) 05/03/2022   Pelvic mass in female 02/07/2020   PCP:  Kirk Ruths, MD Pharmacy:  No Pharmacies Listed    Social Determinants of Health (SDOH) Interventions    Readmission Risk Interventions     No data to display

## 2022-05-05 ENCOUNTER — Inpatient Hospital Stay: Admit: 2022-05-05 | Payer: Medicare Other

## 2022-05-05 ENCOUNTER — Inpatient Hospital Stay
Admit: 2022-05-05 | Discharge: 2022-05-05 | Disposition: A | Discharge status: Home / Self Care | Payer: Medicare Other | Attending: Specialist | Admitting: Specialist

## 2022-05-05 ENCOUNTER — Encounter: Payer: Self-pay | Admitting: Specialist

## 2022-05-05 DIAGNOSIS — E038 Other specified hypothyroidism: Secondary | ICD-10-CM

## 2022-05-05 DIAGNOSIS — S72002A Fracture of unspecified part of neck of left femur, initial encounter for closed fracture: Secondary | ICD-10-CM | POA: Diagnosis not present

## 2022-05-05 DIAGNOSIS — I10 Essential (primary) hypertension: Secondary | ICD-10-CM | POA: Diagnosis not present

## 2022-05-05 LAB — TYPE AND SCREEN
ABO/RH(D): O NEG
Antibody Screen: NEGATIVE
Unit division: 0
Unit division: 0

## 2022-05-05 LAB — BASIC METABOLIC PANEL
Anion gap: 2 — ABNORMAL LOW (ref 5–15)
BUN: 16 mg/dL (ref 8–23)
CO2: 22 mmol/L (ref 22–32)
Calcium: 8.4 mg/dL — ABNORMAL LOW (ref 8.9–10.3)
Chloride: 112 mmol/L — ABNORMAL HIGH (ref 98–111)
Creatinine, Ser: 0.87 mg/dL (ref 0.44–1.00)
GFR, Estimated: >60 mL/min (ref >60)
Glucose, Bld: 151 mg/dL — ABNORMAL HIGH (ref 70–99)
Potassium: 4.2 mmol/L (ref 3.5–5.1)
Sodium: 136 mmol/L (ref 135–145)

## 2022-05-05 LAB — BPAM RBC
Blood Product Expiration Date: 202308192359
Blood Product Expiration Date: 202308192359
Unit Type and Rh: 9500
Unit Type and Rh: 9500

## 2022-05-05 LAB — CBC
HCT: 38.2 % (ref 36.0–46.0)
Hemoglobin: 12.4 g/dL (ref 12.0–15.0)
MCH: 31.2 pg (ref 26.0–34.0)
MCHC: 32.5 g/dL (ref 30.0–36.0)
MCV: 96.2 fL (ref 80.0–100.0)
Platelets: 188 10*3/uL (ref 150–400)
RBC: 3.97 MIL/uL (ref 3.87–5.11)
RDW: 12.3 % (ref 11.5–15.5)
WBC: 11.6 10*3/uL — ABNORMAL HIGH (ref 4.0–10.5)
nRBC: 0 % (ref 0.0–0.2)

## 2022-05-05 LAB — ECHOCARDIOGRAM COMPLETE
AR max vel: 1.6 cm2
AV Area VTI: 1.55 cm2
AV Area mean vel: 1.43 cm2
AV Mean grad: 3.7 mmHg
AV Peak grad: 5.8 mmHg
Ao pk vel: 1.21 m/s
Area-P 1/2: 5.88 cm2
Height: 64 in
S' Lateral: 2.3 cm
Weight: 2239.87 oz

## 2022-05-05 LAB — PREPARE RBC (CROSSMATCH)

## 2022-05-05 NOTE — Progress Notes (Signed)
*  PRELIMINARY RESULTS* Echocardiogram 2D Echocardiogram has been performed.  Cristela Blue 05/05/2022, 1:46 PM

## 2022-05-05 NOTE — Progress Notes (Signed)
Subjective: 1 Day Post-Op Procedure(s) (LRB): ARTHROPLASTY BIPOLAR HIP (HEMIARTHROPLASTY) (Left) The patient is out of bed and sitting up in a chair.  She is alert and oriented.  Her daughter is with her.  She is having a mild to moderate pain only.  She slept last night.  Hemoglobin is 12.4.  Postop x-rays look good.  Plan on partial weightbearing and skilled nursing rehab.  Patient reports pain as mild.  Objective:   VITALS:   Vitals:   05/05/22 0614 05/05/22 0742  BP: 109/68 125/69  Pulse: 85 86  Resp:    Temp:  98.2 F (36.8 C)  SpO2: 97% 100%    Neurologically intact Incision: dressing C/D/I  LABS Recent Labs    05/03/22 2015 05/04/22 0523 05/05/22 0423  HGB 13.0 12.6 12.4  HCT 40.3 39.5 38.2  WBC 18.3* 11.3* 11.6*  PLT 264 211 188    Recent Labs    05/03/22 2015 05/04/22 0523 05/05/22 0423  NA 138 140 136  K 3.8 3.6 4.2  BUN 31* 21 16  CREATININE 1.15* 0.85 0.87  GLUCOSE 153* 131* 151*    Recent Labs    05/03/22 2239  INR 1.1     Assessment/Plan: 1 Day Post-Op Procedure(s) (LRB): ARTHROPLASTY BIPOLAR HIP (HEMIARTHROPLASTY) (Left)   Advance diet Up with therapy Discharge to SNF when stable. Follow-up in office in 2 weeks. Discharge on 81 mg aspirin twice daily.

## 2022-05-05 NOTE — Progress Notes (Signed)
Physical Therapy Treatment Patient Details Name: Krystal Sanchez MRN: [id number] DOB: [birth year] Today's Date: 05/05/2022   History of Present Illness 81 y/o female s/p fall with L hip fracture; underwent hemiarthoplasty 7/17.    PT Comments    Pt continues to be variously anxious and motivated to work with PT.  She was able to tolerate more exercises this afternoon, but struggled more with mobility and was only able to ambulate ~5 ft with min/mod assist to help unweight (maintain 25% WBing) with smaller and more guarded "steps."  Pt eager to do what she can but is ultimately pain and activity tolerance limited.   Recommendations for follow up therapy are one component of a multi-disciplinary discharge planning process, led by the attending physician.  Recommendations may be updated based on patient status, additional functional criteria and insurance authorization.  Follow Up Recommendations  Skilled nursing-short term rehab (<3 hours/day) Can patient physically be transported by private vehicle: No   Assistance Recommended at Discharge Frequent or constant Supervision/Assistance  Patient can return home with the following A lot of help with walking and/or transfers;A little help with bathing/dressing/bathroom;Assistance with cooking/housework;Assist for transportation;Help with stairs or ramp for entrance   Equipment Recommendations  Rolling walker (2 wheels)    Recommendations for Other Services       Precautions / Restrictions Precautions Precautions: Fall Restrictions Weight Bearing Restrictions: Yes LLE Weight Bearing: Partial weight bearing LLE Partial Weight Bearing Percentage or Pounds: 25%     Mobility  Bed Mobility               General bed mobility comments: in recliner pre and post session    Transfers Overall transfer level: Needs assistance Equipment used: Rolling walker (2 wheels) Transfers: Sit to/from Stand, Bed to chair/wheelchair/BSC Sit to  Stand: Mod assist           General transfer comment: Pt unable to rise on her own from recliner with b/l arm rests, ultimately needed considerable assist to get weight shifting up and forward    Ambulation/Gait Ambulation/Gait assistance: Min assist Gait Distance (Feet): 6 Feet Assistive device: Rolling walker (2 wheels)         General Gait Details: Pt again showed good effort with ambulation/stepping but was more fatigued and appeared to have more pain than this AM session.  Pt needed more unweighting assist to insure 25% WBing and was very quick to fatigue in her UEs.   Stairs             Wheelchair Mobility    Modified Rankin (Stroke Patients Only)       Balance Overall balance assessment: Needs assistance Sitting-balance support: No upper extremity supported Sitting balance-Leahy Scale: Good     Standing balance support: Bilateral upper extremity supported Standing balance-Leahy Scale: Fair Standing balance comment: staticaly, poor balance dynamically                            Cognition Arousal/Alertness: Awake/alert Behavior During Therapy: Restless Overall Cognitive Status: Within Functional Limits for tasks assessed                                          Exercises Total Joint Exercises Ankle Circles/Pumps: AROM, 10 reps Quad Sets: Strengthening, 10 reps Gluteal Sets: AROM, 10 reps Hip ABduction/ADduction: AAROM, 10 reps Long Arc Quad: 10 reps,  AROM Knee Flexion: Strengthening, 10 reps, AROM Marching in Standing: AROM, 10 reps, Seated    General Comments        Pertinent Vitals/Pain Pain Assessment Pain Assessment: 0-10 Pain Score: 3  (at rest, increases significantly with any movement) Pain Location: L hip/incision, pain increases with any movement Pain Intervention(s): Premedicated before session    Home Living Family/patient expects to be discharged to:: Skilled nursing facility Living Arrangements:  Alone Available Help at Discharge: Family;Available PRN/intermittently (could provide close to 24/7 assist if needed) Type of Home: House Home Access: Stairs to enter Entrance Stairs-Rails: Right;Left (too wide to use both) Entrance Stairs-Number of Steps: 3   Home Layout: One level Home Equipment: Agricultural consultant (2 wheels);Cane - single point      Prior Function            PT Goals (current goals can now be found in the care plan section) Progress towards PT goals: Progressing toward goals (slow progress)    Frequency    BID      PT Plan Current plan remains appropriate    Co-evaluation              AM-PAC PT "6 Clicks" Mobility   Outcome Measure  Help needed turning from your back to your side while in a flat bed without using bedrails?: A Little Help needed moving from lying on your back to sitting on the side of a flat bed without using bedrails?: A Lot Help needed moving to and from a bed to a chair (including a wheelchair)?: A Lot Help needed standing up from a chair using your arms (e.g., wheelchair or bedside chair)?: A Lot Help needed to walk in hospital room?: A Lot Help needed climbing 3-5 steps with a railing? : Total 6 Click Score: 12    End of Session Equipment Utilized During Treatment: Gait belt Activity Tolerance: Patient limited by fatigue;Patient limited by pain Patient left: in chair;with call bell/phone within reach;with family/visitor present;with nursing/sitter in room Nurse Communication: Mobility status;Weight bearing status PT Visit Diagnosis: Muscle weakness (generalized) (M62.81);Difficulty in walking, not elsewhere classified (R26.2);Pain Pain - Right/Left: Left Pain - part of body: Hip     Time: 1510-1544 PT Time Calculation (min) (ACUTE ONLY): 34 min  Charges:  $Gait Training: 8-22 mins $Therapeutic Exercise: 8-22 mins                     Malachi Pro, DPT 05/05/2022, 5:23 PM

## 2022-05-05 NOTE — Evaluation (Signed)
Occupational Therapy Evaluation Patient Details Name: Krystal Sanchez MRN: [id number] DOB: 05/17/1941 Today's Date: 05/05/2022   History of Present Illness 81 y/o female s/p fall with L hip fracture; underwent hemiarthoplasty 7/17.   Clinical Impression   Krystal Sanchez was seen for OT evaluation this date. Prior to hospital admission, pt was Independnet for mobility and ADLs. Pt lives alone with family available near 24/7. Pt presents to acute OT demonstrating impaired ADL performance and functional mobility 2/2 decreased activity tolerance and functional strength/ROM/balance deficits. Pt currently requires MOD A + RW for BSC t/f. Pt attempted to complete pericare however moderate posterior LOB with single UE support, ultimately required  MAX A pericare in standing. Pt recalled 2/3 posterior hip pcns, cues for application during session, (daughter recalled 3/3). Pt would benefit from skilled OT to address noted impairments and functional limitations (see below for any additional details). Upon hospital discharge, recommend STR to maximize pt safety and return to PLOF.    Recommendations for follow up therapy are one component of a multi-disciplinary discharge planning process, led by the attending physician.  Recommendations may be updated based on patient status, additional functional criteria and insurance authorization.   Follow Up Recommendations  Skilled nursing-short term rehab (<3 hours/day)    Assistance Recommended at Discharge Frequent or constant Supervision/Assistance  Patient can return home with the following A lot of help with walking and/or transfers;A lot of help with bathing/dressing/bathroom    Functional Status Assessment  Patient has had a recent decline in their functional status and demonstrates the ability to make significant improvements in function in a reasonable and predictable amount of time.  Equipment Recommendations  Other (comment) (defer to next venue)     Recommendations for Other Services       Precautions / Restrictions Precautions Precautions: Fall Restrictions Weight Bearing Restrictions: Yes LLE Weight Bearing: Partial weight bearing LLE Partial Weight Bearing Percentage or Pounds: 25%      Mobility Bed Mobility               General bed mobility comments: in chiar start and end of session    Transfers Overall transfer level: Needs assistance Equipment used: Rolling walker (2 wheels) Transfers: Sit to/from Stand, Bed to chair/wheelchair/BSC Sit to Stand: Mod assist     Step pivot transfers: Min assist            Balance Overall balance assessment: Needs assistance Sitting-balance support: No upper extremity supported Sitting balance-Leahy Scale: Good     Standing balance support: Bilateral upper extremity supported Standing balance-Leahy Scale: Fair                             ADL either performed or assessed with clinical judgement   ADL Overall ADL's : Needs assistance/impaired                                       General ADL Comments: MOD A + RW for BSC t/f, MAX A pericare in standing. TOTAL A don socks seated EOC.      Pertinent Vitals/Pain Pain Assessment Pain Assessment: 0-10 Pain Score: 10-Worst pain ever Pain Location: L hip/incision, pain increases with any movement Pain Descriptors / Indicators: Discomfort, Dull, Grimacing Pain Intervention(s): Limited activity within patient's tolerance, Patient requesting pain meds-RN notified     Hand Dominance     Extremity/Trunk  Assessment Upper Extremity Assessment Upper Extremity Assessment: Generalized weakness   Lower Extremity Assessment Lower Extremity Assessment: Generalized weakness       Communication Communication Communication: HOH   Cognition Arousal/Alertness: Awake/alert Behavior During Therapy: Restless Overall Cognitive Status: Within Functional Limits for tasks assessed                                         Home Living Family/patient expects to be discharged to:: Skilled nursing facility Living Arrangements: Alone Available Help at Discharge: Family;Available PRN/intermittently (could provide close to 24/7 assist if needed) Type of Home: House Home Access: Stairs to enter [employer] of Steps: 3 Entrance Stairs-Rails: Right;Left (too wide to use both) Home Layout: One level               Home Equipment: Agricultural consultant (2 wheels);Cane - single point          Prior Functioning/Environment Prior Level of Function : Needs assist             Mobility Comments: Pt with some chronic knee pain, recieved cortisone shots in recent past, does not normally rely on ADs but has needed them occasionally - only really out of the home with supervision. ADLs Comments: family takes care of most home management but apparently she was cooking a Sunday meal for family until the fall        OT Problem List: Decreased strength;Decreased range of motion;Decreased activity tolerance;Impaired balance (sitting and/or standing)      OT Treatment/Interventions: Self-care/ADL training;Therapeutic exercise;Energy conservation;DME and/or AE instruction;Therapeutic activities;Balance training;Patient/family education    OT Goals(Current goals can be found in the care plan section) Acute Rehab OT Goals Patient Stated Goal: to get better OT Goal Formulation: With patient/family Time For Goal Achievement: 05/19/22 Potential to Achieve Goals: Good ADL Goals Pt Will Perform Grooming: with set-up;with supervision;standing Pt Will Perform Lower Body Dressing: with min assist;sit to/from stand;with caregiver independent in assisting;with adaptive equipment Pt Will Transfer to Toilet: ambulating;bedside commode;with min guard assist  OT Frequency: Min 2X/week    Co-evaluation              AM-PAC OT "6 Clicks" Daily Activity     Outcome Measure Help  from another person eating meals?: None Help from another person taking care of personal grooming?: A Little Help from another person toileting, which includes using toliet, bedpan, or urinal?: A Lot Help from another person bathing (including washing, rinsing, drying)?: A Lot Help from another person to put on and taking off regular upper body clothing?: A Little Help from another person to put on and taking off regular lower body clothing?: A Lot 6 Click Score: 16   End of Session Equipment Utilized During Treatment: Rolling walker (2 wheels) Nurse Communication: Patient requests pain meds  Activity Tolerance: Patient tolerated treatment well Patient left: in chair;with call bell/phone within reach;with family/visitor present  OT Visit Diagnosis: Other abnormalities of gait and mobility (R26.89);Muscle weakness (generalized) (M62.81)                Time: 2353-6144 OT Time Calculation (min): 29 min Charges:  OT General Charges $OT Visit: 1 Visit OT Evaluation $OT Eval Moderate Complexity: 1 Mod OT Treatments $Self Care/Home Management : 8-22 mins  Kathie Dike, M.S. OTR/L  05/05/22, 1:55 PM  ascom [phone]

## 2022-05-05 NOTE — Plan of Care (Signed)
  Problem: Education: Goal: Knowledge of General Education information will improve Description: Including pain rating scale, medication(s)/side effects and non-pharmacologic comfort measures Outcome: Progressing   Problem: Health Behavior/Discharge Planning: Goal: Ability to manage health-related needs will improve Outcome: Progressing   Problem: Clinical Measurements: Goal: Ability to maintain clinical measurements within normal limits will improve Outcome: Progressing Goal: Will remain free from infection Outcome: Progressing Goal: Diagnostic test results will improve Outcome: Progressing Goal: Respiratory complications will improve Outcome: Progressing Goal: Cardiovascular complication will be avoided Outcome: Progressing   Problem: Activity: Goal: Risk for activity intolerance will decrease Outcome: Progressing   Problem: Nutrition: Goal: Adequate nutrition will be maintained Outcome: Progressing   Problem: Coping: Goal: Level of anxiety will decrease Outcome: Progressing   Problem: Elimination: Goal: Will not experience complications related to bowel motility Outcome: Progressing Goal: Will not experience complications related to urinary retention Outcome: Progressing   Problem: Pain Managment: Goal: General experience of comfort will improve Outcome: Progressing   Problem: Safety: Goal: Ability to remain free from injury will improve Outcome: Progressing   Problem: Skin Integrity: Goal: Risk for impaired skin integrity will decrease Outcome: Progressing   Problem: Education: Goal: Knowledge of General Education information will improve Description: Including pain rating scale, medication(s)/side effects and non-pharmacologic comfort measures Outcome: Progressing   Problem: Health Behavior/Discharge Planning: Goal: Ability to manage health-related needs will improve Outcome: Progressing   Problem: Clinical Measurements: Goal: Ability to maintain  clinical measurements within normal limits will improve Outcome: Progressing Goal: Will remain free from infection Outcome: Progressing Goal: Diagnostic test results will improve Outcome: Progressing Goal: Respiratory complications will improve Outcome: Progressing Goal: Cardiovascular complication will be avoided Outcome: Progressing   Problem: Activity: Goal: Risk for activity intolerance will decrease Outcome: Progressing   Problem: Nutrition: Goal: Adequate nutrition will be maintained Outcome: Progressing   Problem: Coping: Goal: Level of anxiety will decrease Outcome: Progressing   Problem: Elimination: Goal: Will not experience complications related to bowel motility Outcome: Progressing Goal: Will not experience complications related to urinary retention Outcome: Progressing   Problem: Pain Managment: Goal: General experience of comfort will improve Outcome: Progressing   Problem: Safety: Goal: Ability to remain free from injury will improve Outcome: Progressing   Problem: Skin Integrity: Goal: Risk for impaired skin integrity will decrease Outcome: Progressing   Problem: Education: Goal: Verbalization of understanding the information provided (i.e., activity precautions, restrictions, etc) will improve Outcome: Progressing Goal: Individualized Educational Video(s) Outcome: Progressing   Problem: Activity: Goal: Ability to ambulate and perform ADLs will improve Outcome: Progressing   Problem: Clinical Measurements: Goal: Postoperative complications will be avoided or minimized Outcome: Progressing   Problem: Self-Concept: Goal: Ability to maintain and perform role responsibilities to the fullest extent possible will improve Outcome: Progressing   Problem: Pain Management: Goal: Pain level will decrease Outcome: Progressing   

## 2022-05-05 NOTE — Progress Notes (Signed)
*  PRELIMINARY RESULTS* Echocardiogram 2D Echocardiogram has been performed.  Cristela Blue 05/05/2022, 1:45 PM

## 2022-05-05 NOTE — Progress Notes (Signed)
PROGRESS NOTE   HPI was taken from Dr. Maisie Fus: Krystal Sanchez is a 81 y.o. female with medical history significant of  HTN, hypothyroidism ,arthritis Knee recent interim hx of cortisone shot  who had trip and fall down stairs landing on concrete with immediate severe left hip pain. EMS was called and brought patient to ED. Patient notes she was in her normal state of health prior to fall.  ON ros she denies chest pain , n/v/d/dysuria / sob /fever/ or chills.  She currently does state that pain is uncontrolled. Per rn patient has gotten q1 h fentanyl w/o much relief. Patient does not take pain medication at home.   ED Course:  Afeb, bp 131/112, hr 112, rr 20 , sat 98% on ra     Wbc 18.3, hgb 13, plt 264 NA:138, K 3.8, CL 110, bicarb 21, Glu 153, cr 1.15   CTH IMPRESSION: 1. No acute intracranial process. 2. 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 3. Left frontal lobe encephalomalacia consistent with prior postsurgical or chronic ischemic change.   CT Pelvis IMPRESSION: 1. Impacted subcapital left femoral neck fracture with ventral and varus angulation at the fracture site. 2. 9.1 cm simple appearing left adnexal cyst, increased since prior ultrasound 09/16/2020 where it had measured up to 7.5 cm. If not previously performed, and if the patient would be a therapy candidate should neoplasm be detected, nonemergent outpatient MRI follow-up or gynecologic surgical consultation may be useful.   Cxr: 1.Borderline mild cardiomegaly likely accentuated by technique. 2. Low lung volumes.  Peribronchial thickening of unknown acuity. Tx fentanyl      Tenee Wish Danese  EKC:[id number] DOB: 1940/10/22 DOA: 05/03/2022 PCP: Lauro Regulus, MD   Assessment & Plan:   Principal Problem:   Hip fx [facility]) Active Problems:   Hip fracture (HCC)  Assessment and Plan: Acute impacted subcapital left femoral neck fracture: s/p mechanical  fall. S/p left hip hemiarthroplasty on 05/04/22 as per ortho surg. Norco, dilaudid prn for pain. Ortho surg recs apprec    Elevated troponins: minimal. Likely secondary to demand ischemia. Echo is pending.  No further cardiac inpatient work-up recommended as per cardio    Enlarging Ovarian Cyst: will need MRI outpatient and possible gyn surg f/u pt wants to pursue  HTN: continue on losartan, HCTZ   Hypothyroidism: continue on home dose of synthroid        DVT prophylaxis: SCDs Code Status: full  Family Communication: discussed pt's care w/ pt's family at bedside and answered their questions  Disposition Plan:  PT/OT need to eval the pt   Level of care: Med-Surg  Status is: Inpatient Remains inpatient appropriate because: severity of illness   Consultants:  Ortho surg Cardio   Procedures:   Antimicrobials:    Subjective: Pt still of left hip pain   Objective: Vitals:   05/04/22 1634 05/04/22 1712 05/04/22 2019 05/05/22 0614  BP: 112/69 115/64 (!) 115/53 109/68  Pulse:  65 91 85  Resp:  18 20   Temp:  98.7 F (37.1 C) 98.1 F (36.7 C)   TempSrc:  Oral Oral   SpO2:  100% 95% 97%  Weight:      Height:        Intake/Output Summary (Last 24 hours) at 05/05/2022 0735 Last data filed at 05/05/2022 0643 Gross per 24 hour  Intake 1086.46 ml  Output 1875 ml  Net -788.54 ml   Filed Weights   05/03/22 1957 05/04/22  1309  Weight: 63.5 kg 63.5 kg    Examination:  General exam: Appears calm & comfortable  Respiratory system: clear breath sounds b/l  Cardiovascular system: S1/S2+. No rubs or gallops Gastrointestinal system: Abd is soft, NT, ND & normal bowel sounds  Central nervous system:  alert and oriented. Moves all extremities Psychiatry: judgement and insight appear at baseline. Appropriate mood and affect     Data Reviewed: I have personally reviewed following labs and imaging studies  CBC: Recent Labs  Lab 05/03/22 2015 05/04/22 0523  05/05/22 0423  WBC 18.3* 11.3* 11.6*  HGB 13.0 12.6 12.4  HCT 40.3 39.5 38.2  MCV 96.4 97.3 96.2  PLT 264 211 188   Basic Metabolic Panel: Recent Labs  Lab 05/03/22 2015 05/03/22 2239 05/04/22 0523 05/05/22 0423  NA 138  --  140 136  K 3.8  --  3.6 4.2  CL 110  --  114* 112*  CO2 21*  --  20* 22  GLUCOSE 153*  --  131* 151*  BUN 31*  --  21 16  CREATININE 1.15*  --  0.85 0.87  CALCIUM 8.8* 8.3* 8.2* 8.4*   GFR: Estimated Creatinine Clearance: 43.8 mL/min (by C-G formula based on SCr of 0.87 mg/dL). Liver Function Tests: Recent Labs  Lab 05/03/22 2015  AST 16  ALT 20  ALKPHOS 64  BILITOT 0.9  PROT 6.3*  ALBUMIN 3.9   No results for input(s): "LIPASE", "AMYLASE" in the last 168 hours. No results for input(s): "AMMONIA" in the last 168 hours. Coagulation Profile: Recent Labs  Lab 05/03/22 2239  INR 1.1   Cardiac Enzymes: No results for input(s): "CKTOTAL", "CKMB", "CKMBINDEX", "TROPONINI" in the last 168 hours. BNP (last 3 results) No results for input(s): "PROBNP" in the last 8760 hours. HbA1C: No results for input(s): "HGBA1C" in the last 72 hours. CBG: No results for input(s): "GLUCAP" in the last 168 hours. Lipid Profile: No results for input(s): "CHOL", "HDL", "LDLCALC", "TRIG", "CHOLHDL", "LDLDIRECT" in the last 72 hours. Thyroid Function Tests: No results for input(s): "TSH", "T4TOTAL", "FREET4", "T3FREE", "THYROIDAB" in the last 72 hours. Anemia Panel: No results for input(s): "VITAMINB12", "FOLATE", "FERRITIN", "TIBC", "IRON", "RETICCTPCT" in the last 72 hours. Sepsis Labs: No results for input(s): "PROCALCITON", "LATICACIDVEN" in the last 168 hours.  No results found for this or any previous visit (from the past 240 hour(s)).       Radiology Studies: DG Hip Port Unilat With Pelvis 1V Left  Result Date: 05/04/2022 CLINICAL DATA:  Status post left total hip arthroplasty. EXAM: DG HIP (WITH OR WITHOUT PELVIS) 1V PORT LEFT COMPARISON:   05/03/2022 CT pelvis FINDINGS: Status post interval left total hip arthroplasty. No periprosthetic lucency or fracture. Near anatomic alignment of the prosthetic components. Air within the soft tissues is not unexpected postoperatively. Superficial skin staples. No acute abnormality in the remainder of the pelvis or right hip. IMPRESSION: Expected postoperative appearance, status post left total hip arthroplasty. Electronically Signed   By: Wiliam Ke M.D.   On: 05/04/2022 17:02   DG Chest Portable 1 View  Result Date: 05/03/2022 CLINICAL DATA:  Preop.  Fall.  Left hip fracture. EXAM: PORTABLE CHEST 1 VIEW COMPARISON:  None Available. FINDINGS: Lung volumes are low. Borderline mild cardiomegaly likely accentuated by technique. Mild peribronchial thickening of unknown acuity. No pneumothorax or large pleural effusion. No acute rib fractures. IMPRESSION: 1. Borderline mild cardiomegaly likely accentuated by technique. 2. Low lung volumes.  Peribronchial thickening of unknown acuity. Electronically Signed  By: Narda Rutherford M.D.   On: 05/03/2022 21:36   CT PELVIS WO CONTRAST  Result Date: 05/03/2022 CLINICAL DATA:  Larey Seat downstairs, left hip pain EXAM: CT PELVIS WITHOUT CONTRAST TECHNIQUE: Multidetector CT imaging of the pelvis was performed following the standard protocol without intravenous contrast. RADIATION DOSE REDUCTION: This exam was performed according to the departmental dose-optimization program which includes automated exposure control, adjustment of the mA and/or kV according to patient size and/or use of iterative reconstruction technique. COMPARISON:  None Available. FINDINGS: Urinary Tract:  Distal ureters and bladder are grossly unremarkable. Bowel: No bowel obstruction or ileus. No bowel wall thickening or inflammatory change. Vascular/Lymphatic: Atherosclerosis of the aorta and its distal branches. No pathologic adenopathy. Reproductive: There is a 9.1 x 6.2 cm simple appearing left  adnexal cyst again noted, previously having measured up to 7.5 cm. If not previously evaluated, and if the patient would be a therapy candidate should neoplasm be detected, MRI follow-up may be useful. Uterus is atrophic. Right adnexa is unremarkable. Other: No free fluid or free intraperitoneal gas. No abdominal wall hernia. Musculoskeletal: There is an acute impacted subcapital left femoral neck fracture, with varus and ventral angulation at the fracture site. No dislocation. The remainder of the visualized bony pelvis is unremarkable. Symmetrical bilateral hip osteoarthritis. Reconstructed images demonstrate no additional findings. IMPRESSION: 1. Impacted subcapital left femoral neck fracture with ventral and varus angulation at the fracture site. 2. 9.1 cm simple appearing left adnexal cyst, increased since prior ultrasound 09/16/2020 where it had measured up to 7.5 cm. If not previously performed, and if the patient would be a therapy candidate should neoplasm be detected, nonemergent outpatient MRI follow-up or gynecologic surgical consultation may be useful. 3.  Aortic Atherosclerosis (ICD10-I70.0). Electronically Signed   By: Sharlet Salina M.D.   On: 05/03/2022 21:02   CT HEAD WO CONTRAST ( )  Result Date: 05/03/2022 CLINICAL DATA:  Larey Seat down stairs, left hip pain EXAM: CT HEAD WITHOUT CONTRAST TECHNIQUE: Contiguous axial images were obtained from the base of the skull through the vertex without intravenous contrast. RADIATION DOSE REDUCTION: This exam was performed according to the departmental dose-optimization program which includes automated exposure control, adjustment of the mA and/or kV according to patient size and/or use of iterative reconstruction technique. COMPARISON:  None Available. FINDINGS: Brain: Left frontal lobe encephalomalacia consistent with prior infarct or chronic postsurgical change. Hypodensities throughout the periventricular white matter and basal ganglia most consistent  with chronic small vessel ischemic changes. No other signs of acute infarct or hemorrhage. Lateral ventricles and remaining midline structures are unremarkable. No acute extra-axial fluid collections. No mass effect. Vascular: No hyperdense vessel or unexpected calcification. Skull: Prior left temporal craniotomy.  No acute bony abnormality. Sinuses/Orbits: No acute finding. Other: None. IMPRESSION: 1. No acute intracranial process. 2. 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 3. Left frontal lobe encephalomalacia consistent with prior postsurgical or chronic ischemic change. Electronically Signed   By: Sharlet Salina M.D.   On: 05/03/2022 20:56        Scheduled Meds:  atorvastatin  40 mg Oral q1800   chlorhexidine   Topical Once   docusate sodium  100 mg Oral BID   enoxaparin (LOVENOX) injection  30 mg Subcutaneous Q24H   feeding supplement  237 mL Oral BID BM   ferrous sulfate  325 mg Oral Q breakfast   losartan  100 mg Oral Daily   And   hydrochlorothiazide  12.5 mg Oral Daily  levothyroxine  75 mcg Oral Q0600   multivitamin with minerals  1 tablet Oral Daily   Vitamin D (Ergocalciferol)  50,000 Units Oral Q7 days   Continuous Infusions:  sodium chloride Stopped (05/05/22 0621)   sodium chloride 75 mL/hr at 05/04/22 1328    ceFAZolin (ANCEF) IV 200 mL/hr at 05/05/22 0643   methocarbamol (ROBAXIN) IV       LOS: 2 days    Time spent: 30 mins     Charise Killian, MD [facility] Pager [phone]  If 7PM-7AM, please contact night-coverage [email or web address] 05/05/2022, 7:35 AM

## 2022-05-05 NOTE — NC FL2 (Signed)
[place] MEDICAID FL2 LEVEL OF CARE SCREENING TOOL     IDENTIFICATION  Patient Name: Krystal Sanchez Birthdate: 11/01/1940 Sex: female Admission Date (Current Location): 05/03/2022  [facility] and [place] Number:  Chiropodist and Address:  [facility], [address], [place], [place] [address]      Provider Number: [id number]  Attending Physician Name and Address:  Charise Killian, MD  Relative Name and Phone Number:  Tresa Endo daughter [phone]    Current Level of Care: Hospital Recommended Level of Care: Skilled Nursing Facility Prior Approval Number:    Date Approved/Denied:   PASRR Number: [id number]  Discharge Plan: SNF    Current Diagnoses: Patient Active Problem List   Diagnosis Date Noted   Hip fracture (HCC) 05/04/2022   Hip fx (HCC) 05/03/2022   Pelvic mass in female 02/07/2020    Orientation RESPIRATION BLADDER Height & Weight     Self, Time, Situation, Place  Normal Continent, External catheter Weight: 63.5 kg Height:  5\' 4"  (162.6 cm)  BEHAVIORAL SYMPTOMS/MOOD NEUROLOGICAL BOWEL NUTRITION STATUS      Continent Diet (See dc summary)  AMBULATORY STATUS COMMUNICATION OF NEEDS Skin   Extensive Assist Verbally Normal, Surgical wounds                       Personal Care Assistance Level of Assistance  Bathing, Feeding, Dressing Bathing Assistance: Limited assistance Feeding assistance: Independent Dressing Assistance: Limited assistance     Functional Limitations Info             SPECIAL CARE FACTORS FREQUENCY  PT (By licensed PT), OT (By licensed OT)     PT Frequency: 5 times per week OT Frequency: 5 times per week            Contractures      Additional Factors Info  Code Status, Allergies Code Status Info: full code Allergies Info: NKDA           Current Medications (05/05/2022):  This is the current hospital active medication list Current Facility-Administered Medications   Medication Dose Route Frequency Provider Last Rate Last Admin   0.45 % sodium chloride infusion   Intravenous Continuous 05/07/2022, MD   Stopped at 05/05/22 [phone]   acetaminophen (TYLENOL) tablet 325-650 mg  325-650 mg Oral Q6H PRN 2423, MD       alum & mag hydroxide-simeth (MAALOX/MYLANTA) 200-200-20 MG/5ML suspension 30 mL  30 mL Oral Q4H PRN 04-27-2001, MD       atorvastatin (LIPITOR) tablet 40 mg  40 mg Oral q1800 Deeann Saint, MD   40 mg at 05/04/22 1756   bisacodyl (DULCOLAX) suppository 10 mg  10 mg Rectal Daily PRN 05/06/22, MD       ceFAZolin (ANCEF) IVPB 2g/100 mL premix  2 g Intravenous Deeann Saint, MD 200 mL/hr at 05/05/22 0643 Infusion Verify at 05/05/22 0643   chlorhexidine (HIBICLENS) 4 % liquid   Topical Once 05/07/22, MD       docusate sodium (COLACE) capsule 100 mg  100 mg Oral BID Deeann Saint, MD   100 mg at 05/05/22 0911   enoxaparin (LOVENOX) injection 30 mg  30 mg Subcutaneous Q24H 05/07/22, MD   30 mg at 05/05/22 0843   feeding supplement (ENSURE ENLIVE / ENSURE PLUS) liquid 237 mL  237 mL Oral BID BM 05/07/22, MD   237 mL at 05/05/22 0911   ferrous sulfate tablet 325  mg  325 mg Oral Q breakfast Deeann Saint, MD   325 mg at 05/05/22 0843   losartan (COZAAR) tablet 100 mg  100 mg Oral Daily Deeann Saint, MD   100 mg at 05/05/22 4128   And   hydrochlorothiazide (HYDRODIURIL) tablet 12.5 mg  12.5 mg Oral Daily Deeann Saint, MD   12.5 mg at 05/05/22 0910   HYDROcodone-acetaminophen (NORCO/VICODIN) 5-325 MG per tablet 1 tablet  1 tablet Oral Q4H PRN Deeann Saint, MD   1 tablet at 05/05/22 1155   HYDROmorphone (DILAUDID) injection 0.5 mg  0.5 mg Intravenous Q2H PRN Deeann Saint, MD   0.5 mg at 05/04/22 1039   labetalol (NORMODYNE) injection 10 mg  10 mg Intravenous Q2H PRN Deeann Saint, MD       levothyroxine (SYNTHROID) tablet 75 mcg  75 mcg Oral Q0600 Deeann Saint, MD   75 mcg at 05/05/22 0618    menthol-cetylpyridinium (CEPACOL) lozenge 3 mg  1 lozenge Oral PRN Deeann Saint, MD       Or   phenol (CHLORASEPTIC) mouth spray 1 spray  1 spray Mouth/Throat PRN Deeann Saint, MD       methocarbamol (ROBAXIN) tablet 500 mg  500 mg Oral Q6H PRN Deeann Saint, MD   500 mg at 05/05/22 7867   Or   methocarbamol (ROBAXIN) 500 mg in dextrose 5 % 50 mL IVPB  500 mg Intravenous Q6H PRN Deeann Saint, MD       metoCLOPramide (REGLAN) tablet 5-10 mg  5-10 mg Oral Q8H PRN Deeann Saint, MD       Or   metoCLOPramide (REGLAN) injection 5-10 mg  5-10 mg Intravenous Q8H PRN Deeann Saint, MD       morphine (PF) 2 MG/ML injection 0.5-1 mg  0.5-1 mg Intravenous Q2H PRN Deeann Saint, MD   1 mg at 05/05/22 0911   multivitamin with minerals tablet 1 tablet  1 tablet Oral Daily Deeann Saint, MD   1 tablet at 05/05/22 0910   ondansetron (ZOFRAN) tablet 4 mg  4 mg Oral Q6H PRN Deeann Saint, MD       Or   ondansetron [facility]) injection 4 mg  4 mg Intravenous Q6H PRN Deeann Saint, MD       pantoprazole (PROTONIX) EC tablet 40 mg  40 mg Oral Daily PRN Deeann Saint, MD       senna-docusate (Senokot-S) tablet 1 tablet  1 tablet Oral QHS PRN Deeann Saint, MD       sodium phosphate (FLEET) 7-19 GM/118ML enema 1 enema  1 enema Rectal Once PRN Deeann Saint, MD       Vitamin D (Ergocalciferol) (DRISDOL) capsule 50,000 Units  50,000 Units Oral Q7 days Deeann Saint, MD       zolpidem Remus Loffler) tablet 5 mg  5 mg Oral QHS PRN Deeann Saint, MD         Discharge Medications: Please see discharge summary for a list of discharge medications.  Relevant Imaging Results:  Relevant Lab Results:   Additional Information SS# [id number]  Marlowe Sax, RN

## 2022-05-05 NOTE — Evaluation (Signed)
Physical Therapy Evaluation Patient Details Name: Krystal Sanchez MRN: [id number] DOB: 24-Dec-1940 Today's Date: 05/05/2022  History of Present Illness  81 y/o female s/p fall with L hip fracture; underwent hemiarthoplasty 7/17.  Clinical Impression  Pt anxious and pain limited but ultimately showed good effort despite this.  She was able to do a few seated exercises per tolerance, some with light resistance but mostly with AAROM for hip movement.  She needed some assist with sit<>stand, able to ambulate ~32ft with maintaining 25% PWBing but clearly fatiguing with UEs and in general and we deferred further ambulation to insure maintenance of precautions.  Pt was hopeful to be able to go home but per today's performance STR is the only safe and clinically appropriate recommendation - pt and family understand and are in agreement.      Recommendations for follow up therapy are one component of a multi-disciplinary discharge planning process, led by the attending physician.  Recommendations may be updated based on patient status, additional functional criteria and insurance authorization.  Follow Up Recommendations Skilled nursing-short term rehab (<3 hours/day) Can patient physically be transported by private vehicle: No    Assistance Recommended at Discharge Frequent or constant Supervision/Assistance  Patient can return home with the following  A lot of help with walking and/or transfers;A little help with bathing/dressing/bathroom;Assistance with cooking/housework;Assist for transportation;Help with stairs or ramp for entrance    Equipment Recommendations Rolling walker (2 wheels)  Recommendations for Other Services       Functional Status Assessment Patient has had a recent decline in their functional status and demonstrates the ability to make significant improvements in function in a reasonable and predictable amount of time.     Precautions / Restrictions Precautions Precautions:  Fall Restrictions Weight Bearing Restrictions: Yes LLE Weight Bearing: Partial weight bearing LLE Partial Weight Bearing Percentage or Pounds: 25%      Mobility  Bed Mobility               General bed mobility comments: in recliner on arrival,  needed assist from nursing to get up    Transfers Overall transfer level: Needs assistance Equipment used: Rolling walker (2 wheels) Transfers: Sit to/from Stand Sit to Stand: Min assist, Mod assist           General transfer comment: Plenty of cuing for set up, sequencing, precautions and WBing - pt made good effort ultimately needed direct assist to attain standing and to control descent    Ambulation/Gait Ambulation/Gait assistance: Min assist Gait Distance (Feet): 8 Feet Assistive device: Rolling walker (2 wheels)         General Gait Details: Pt made very good effort with limited distance ambulation bout.  She was able to maintain 25%PWBing with heavy walker use and short, step-to strategy but faitgued quickly and appeared to start putting more weight on the L as she fatigued - further distance deferred this session.  Stairs            Wheelchair Mobility    Modified Rankin (Stroke Patients Only)       Balance Overall balance assessment: Needs assistance   Sitting balance-Leahy Scale: Good     Standing balance support: Bilateral upper extremity supported Standing balance-Leahy Scale: Fair                               Pertinent Vitals/Pain Pain Assessment Pain Assessment: 0-10 Pain Score: 7  Pain Location: L  hip/incision, pain increases with any movement    Home Living Family/patient expects to be discharged to:: Skilled nursing facility Living Arrangements: Alone Available Help at Discharge: Family;Available PRN/intermittently (could provide close to 24/7 assist if needed) Type of Home: House Home Access: Stairs to enter Entrance Stairs-Rails: Right;Left (too wide to use  both) Entrance Stairs-Number of Steps: 3   Home Layout: One level Home Equipment: Agricultural consultant (2 wheels);Cane - single point      Prior Function Prior Level of Function : Needs assist             Mobility Comments: Pt with some chronic knee pain, recieved cortisone shots in recent past, does not normally rely on ADs but has needed them occasionally - only really out of the home with supervision. ADLs Comments: family takes care of most home management but apparently she was cooking a Sunday meal for family until the fall     Hand Dominance        Extremity/Trunk Assessment   Upper Extremity Assessment Upper Extremity Assessment: Generalized weakness    Lower Extremity Assessment Lower Extremity Assessment: Generalized weakness (expected post op weakness/pain in L hip and less so but still present weakness in more distal L LE)       Communication   Communication: HOH  Cognition Arousal/Alertness: Awake/alert Behavior During Therapy: Restless Overall Cognitive Status: Within Functional Limits for tasks assessed                                          General Comments      Exercises Total Joint Exercises Ankle Circles/Pumps: AROM, 10 reps Quad Sets: Strengthening, 10 reps Hip ABduction/ADduction: AAROM, 5 reps Long Arc Quad: AROM, 5 reps Marching in Standing: AROM, 5 reps, Seated   Assessment/Plan    PT Assessment Patient needs continued PT services  PT Problem List Decreased strength;Decreased range of motion;Decreased activity tolerance;Decreased balance;Decreased mobility;Decreased coordination;Decreased knowledge of use of DME;Decreased safety awareness;Pain       PT Treatment Interventions DME instruction;Gait training;Stair training;Functional mobility training;Therapeutic activities;Therapeutic exercise;Balance training;Neuromuscular re-education;Patient/family education    PT Goals (Current goals can be found in the Care Plan  section)  Acute Rehab PT Goals Patient Stated Goal: get moving well enough to go home PT Goal Formulation: With patient/family Time For Goal Achievement: 05/18/22 Potential to Achieve Goals: Fair    Frequency BID     Co-evaluation               AM-PAC PT "6 Clicks" Mobility  Outcome Measure Help needed turning from your back to your side while in a flat bed without using bedrails?: A Little Help needed moving from lying on your back to sitting on the side of a flat bed without using bedrails?: A Lot Help needed moving to and from a bed to a chair (including a wheelchair)?: A Lot Help needed standing up from a chair using your arms (e.g., wheelchair or bedside chair)?: A Lot Help needed to walk in hospital room?: A Lot Help needed climbing 3-5 steps with a railing? : Total 6 Click Score: 12    End of Session Equipment Utilized During Treatment: Gait belt Activity Tolerance: Patient limited by fatigue;Patient limited by pain Patient left: in chair;with call bell/phone within reach;with family/visitor present;with nursing/sitter in room Nurse Communication: Mobility status;Weight bearing status PT Visit Diagnosis: Muscle weakness (generalized) (M62.81);Difficulty in walking, not elsewhere  classified (R26.2);Pain Pain - Right/Left: Left Pain - part of body: Hip    Time: 0922-1001 PT Time Calculation (min) (ACUTE ONLY): 39 min   Charges:   PT Evaluation $PT Eval Low Complexity: 1 Low PT Treatments $Gait Training: 8-22 mins $Therapeutic Exercise: 8-22 mins        Malachi Pro, DPT 05/05/2022, 10:45 AM

## 2022-05-05 NOTE — Anesthesia Postprocedure Evaluation (Signed)
Anesthesia Post Note  Patient: Krystal Sanchez  Procedure(s) Performed: ARTHROPLASTY BIPOLAR HIP (HEMIARTHROPLASTY) (Left: Hip)  Patient location during evaluation: Nursing Unit Anesthesia Type: Combined General/Spinal Level of consciousness: awake and alert and oriented Pain management: pain level controlled Vital Signs Assessment: post-procedure vital signs reviewed and stable Respiratory status: respiratory function stable Cardiovascular status: stable Postop Assessment: no headache, no backache, patient able to bend at knees, no apparent nausea or vomiting, able to ambulate and adequate PO intake Anesthetic complications: no   No notable events documented.   Last Vitals:  Vitals:   05/05/22 0614 05/05/22 0742  BP: 109/68 125/69  Pulse: 85 86  Resp:    Temp:  36.8 C  SpO2: 97% 100%    Last Pain:  Vitals:   05/05/22 0742  TempSrc: Oral  PainSc:                  Zachary George

## 2022-05-05 NOTE — TOC Progression Note (Signed)
Transition of Care [facility]) - Progression Note    Patient Details  Name: Krystal Sanchez MRN: [id number] Date of Birth: 07-Feb-1941  [facility] CM/SW Contact  Marlowe Sax, RN Phone Number: 05/05/2022, 12:27 PM  Clinical Narrative:    Spoke to the patiewnt's daughter Tresa Endo, They have a lot of assistance at home but they are not able to provide the level of care needed, they are agreeable for the patient to go to [facility] Bedsearch sent, PASSR obtain edm FL2 complete   Expected Discharge Plan:  (TBD) Barriers to Discharge: Continued Medical Work up  Expected Discharge Plan and Services Expected Discharge Plan:  (TBD)       Living arrangements for the past 2 months: Single Family Home                                       Social Determinants of Health (SDOH) Interventions    Readmission Risk Interventions     No data to display

## 2022-05-06 DIAGNOSIS — S72009A Fracture of unspecified part of neck of unspecified femur, initial encounter for closed fracture: Secondary | ICD-10-CM | POA: Diagnosis not present

## 2022-05-06 LAB — CBC
HCT: 36.8 % (ref 36.0–46.0)
Hemoglobin: 11.9 g/dL — ABNORMAL LOW (ref 12.0–15.0)
MCH: 31.2 pg (ref 26.0–34.0)
MCHC: 32.3 g/dL (ref 30.0–36.0)
MCV: 96.3 fL (ref 80.0–100.0)
Platelets: 173 10*3/uL (ref 150–400)
RBC: 3.82 MIL/uL — ABNORMAL LOW (ref 3.87–5.11)
RDW: 12.4 % (ref 11.5–15.5)
WBC: 10 10*3/uL (ref 4.0–10.5)
nRBC: 0 % (ref 0.0–0.2)

## 2022-05-06 LAB — BASIC METABOLIC PANEL
Anion gap: 1 — ABNORMAL LOW (ref 5–15)
BUN: 19 mg/dL (ref 8–23)
CO2: 25 mmol/L (ref 22–32)
Calcium: 8.3 mg/dL — ABNORMAL LOW (ref 8.9–10.3)
Chloride: 111 mmol/L (ref 98–111)
Creatinine, Ser: 0.91 mg/dL (ref 0.44–1.00)
GFR, Estimated: >60 mL/min (ref >60)
Glucose, Bld: 124 mg/dL — ABNORMAL HIGH (ref 70–99)
Potassium: 4 mmol/L (ref 3.5–5.1)
Sodium: 137 mmol/L (ref 135–145)

## 2022-05-06 LAB — SURGICAL PATHOLOGY

## 2022-05-06 MED ORDER — PANTOPRAZOLE SODIUM 40 MG PO TBEC: 40.0000 mg | DELAYED_RELEASE_TABLET | Freq: Every day | ORAL | Status: DC

## 2022-05-06 MED ORDER — HYDROCODONE-ACETAMINOPHEN 5-325 MG PO TABS
0.5000 | ORAL_TABLET | ORAL | Status: DC | PRN
  Administered 2022-05-07: 0.5 via ORAL
  Filled 2022-05-06: qty 1

## 2022-05-06 MED ORDER — PANTOPRAZOLE SODIUM 40 MG PO TBEC
40.0000 mg | DELAYED_RELEASE_TABLET | Freq: Every day | ORAL | Status: DC
  Administered 2022-05-07: 40 mg via ORAL
  Filled 2022-05-06: qty 1

## 2022-05-06 MED ORDER — ENOXAPARIN SODIUM 40 MG/0.4ML IJ SOSY
40.0000 mg | PREFILLED_SYRINGE | INTRAMUSCULAR | Status: DC
  Administered 2022-05-07: 40 mg via SUBCUTANEOUS
  Filled 2022-05-06: qty 0.4

## 2022-05-06 NOTE — Care Management Important Message (Signed)
Important Message  Patient Details  Name: Krystal Sanchez MRN: [id number] Date of Birth: 1940/11/23   Medicare Important Message Given:  Yes     [name] [name] 05/06/2022, 11:27 AM

## 2022-05-06 NOTE — Progress Notes (Signed)
Physical Therapy Treatment Patient Details Name: Krystal Sanchez MRN: [id number] DOB: [birth year] Today's Date: 05/06/2022   History of Present Illness 81 y/o female s/p fall with L hip fracture; underwent hemiarthoplasty 7/17.    PT Comments    Pt continues to be slow and labored with all aspects of PT but overall did well despite feeling poorly and having considerable pain.  Her ambulation remains a very guarded and slow step-to effort, reports fatigue in UEs with the unweighting for 25% WBing.  Pt motivated but ultimately remains functionally limited with slow PT gains despite great effort and motivation.     Recommendations for follow up therapy are one component of a multi-disciplinary discharge planning process, led by the attending physician.  Recommendations may be updated based on patient status, additional functional criteria and insurance authorization.  Follow Up Recommendations  Skilled nursing-short term rehab (<3 hours/day) Can patient physically be transported by private vehicle: No   Assistance Recommended at Discharge Frequent or constant Supervision/Assistance  Patient can return home with the following A lot of help with walking and/or transfers;A little help with bathing/dressing/bathroom;Assistance with cooking/housework;Assist for transportation;Help with stairs or ramp for entrance   Equipment Recommendations  Rolling walker (2 wheels)    Recommendations for Other Services       Precautions / Restrictions Precautions Precautions: Fall Restrictions Weight Bearing Restrictions: Yes LLE Weight Bearing: Partial weight bearing LLE Partial Weight Bearing Percentage or Pounds: 25%     Mobility  Bed Mobility Overal bed mobility: Needs Assistance Bed Mobility: Sit to Supine       Sit to supine: Mod assist   General bed mobility comments: heavy assist to get LEs back up into bed and then to scoot up    Transfers Overall transfer level: Needs  assistance Equipment used: Rolling walker (2 wheels) Transfers: Sit to/from Stand Sit to Stand: Min assist, Min guard           General transfer comment: multiple sit to stand efforts today, pt needed plenty of cuing and encouragement but with b/l UEs, close guarding and extra time/effort she was able to rise with only CGA each time    Ambulation/Gait Ambulation/Gait assistance: Min assist Gait Distance (Feet): 15 Feet Assistive device: Rolling walker (2 wheels)         General Gait Details: Slow, step to gait.  c/o fatigue in UEs with modest effort.  Pt showing good determination but overall limited by pain, tolerance and fatigue.   Stairs             Wheelchair Mobility    Modified Rankin (Stroke Patients Only)       Balance Overall balance assessment: Needs assistance Sitting-balance support: No upper extremity supported Sitting balance-Leahy Scale: Good Sitting balance - Comments: Pt leaning away from L hip but able to maintain sitting relatively confidently   Standing balance support: Bilateral upper extremity supported Standing balance-Leahy Scale: Fair Standing balance comment: fair statically, poor balance with heavy UE need dynamically                            Cognition Arousal/Alertness: Awake/alert Behavior During Therapy: Anxious Overall Cognitive Status: Within Functional Limits for tasks assessed                                          Exercises Total Joint  Exercises Ankle Circles/Pumps: AROM, 10 reps Quad Sets: Strengthening, 10 reps Short Arc Quad: Strengthening, AROM, 10 reps Heel Slides: AROM, AAROM, 10 reps (with lightly resisted leg ext) Hip ABduction/ADduction: AAROM, 10 reps Long Arc Quad: 10 reps, AROM Knee Flexion: Strengthening, 10 reps, AROM Marching in Standing: AROM, 10 reps, Seated    General Comments        Pertinent Vitals/Pain Pain Assessment Pain Assessment: 0-10 Pain Score: 7   Pain Location: per her usual minimal pain at rest that increases with any movement of L LE    Home Living                          Prior Function            PT Goals (current goals can now be found in the care plan section) Progress towards PT goals: Progressing toward goals (slow progress)    Frequency    BID      PT Plan Current plan remains appropriate    Co-evaluation              AM-PAC PT "6 Clicks" Mobility   Outcome Measure  Help needed turning from your back to your side while in a flat bed without using bedrails?: A Little Help needed moving from lying on your back to sitting on the side of a flat bed without using bedrails?: A Lot Help needed moving to and from a bed to a chair (including a wheelchair)?: A Lot Help needed standing up from a chair using your arms (e.g., wheelchair or bedside chair)?: A Lot Help needed to walk in hospital room?: A Lot Help needed climbing 3-5 steps with a railing? : Total 6 Click Score: 12    End of Session Equipment Utilized During Treatment: Gait belt Activity Tolerance: Patient limited by fatigue;Patient limited by pain Patient left: in chair;with call bell/phone within reach;with family/visitor present;with nursing/sitter in room Nurse Communication: Mobility status;Weight bearing status PT Visit Diagnosis: Muscle weakness (generalized) (M62.81);Difficulty in walking, not elsewhere classified (R26.2);Pain Pain - Right/Left: Left Pain - part of body: Hip     Time: 9381-0175 PT Time Calculation (min) (ACUTE ONLY): 42 min  Charges:  $Gait Training: 8-22 mins $Therapeutic Exercise: 8-22 mins $Therapeutic Activity: 8-22 mins                     Malachi Pro, DPT 05/06/2022, 3:56 PM

## 2022-05-06 NOTE — Progress Notes (Signed)
PT Cancellation Note  Patient Details Name: Krystal Sanchez MRN: [id number] DOB: 27-Apr-1941   Cancelled Treatment:    Reason Eval/Treat Not Completed: Other (comment) Spoke with nursing who reports that pt has had some nausea this morning.  On arrival to room pt on BSC (having BM) and after initial introductions she said she felt she needed to burp and subsequently vomited.  Nursing notified and ultimately we decided to allow her some time to get nausea meds, finish with BM and ultimately get feeling better to allow best possible PT session.    Malachi Pro, DPT 05/06/2022, 8:51 AM

## 2022-05-06 NOTE — Plan of Care (Signed)
  Problem: Education: Goal: Knowledge of General Education information will improve Description: Including pain rating scale, medication(s)/side effects and non-pharmacologic comfort measures Outcome: Progressing   Problem: Health Behavior/Discharge Planning: Goal: Ability to manage health-related needs will improve Outcome: Progressing   Problem: Clinical Measurements: Goal: Ability to maintain clinical measurements within normal limits will improve Outcome: Progressing Goal: Will remain free from infection Outcome: Progressing Goal: Diagnostic test results will improve Outcome: Progressing Goal: Respiratory complications will improve Outcome: Progressing Goal: Cardiovascular complication will be avoided Outcome: Progressing   Problem: Activity: Goal: Risk for activity intolerance will decrease Outcome: Progressing   Problem: Nutrition: Goal: Adequate nutrition will be maintained Outcome: Progressing   Problem: Coping: Goal: Level of anxiety will decrease Outcome: Progressing   Problem: Elimination: Goal: Will not experience complications related to bowel motility Outcome: Progressing Goal: Will not experience complications related to urinary retention Outcome: Progressing   Problem: Pain Managment: Goal: General experience of comfort will improve Outcome: Progressing   Problem: Safety: Goal: Ability to remain free from injury will improve Outcome: Progressing   Problem: Skin Integrity: Goal: Risk for impaired skin integrity will decrease Outcome: Progressing   Problem: Education: Goal: Knowledge of General Education information will improve Description: Including pain rating scale, medication(s)/side effects and non-pharmacologic comfort measures Outcome: Progressing   Problem: Health Behavior/Discharge Planning: Goal: Ability to manage health-related needs will improve Outcome: Progressing   Problem: Clinical Measurements: Goal: Ability to maintain  clinical measurements within normal limits will improve Outcome: Progressing Goal: Will remain free from infection Outcome: Progressing Goal: Diagnostic test results will improve Outcome: Progressing Goal: Respiratory complications will improve Outcome: Progressing Goal: Cardiovascular complication will be avoided Outcome: Progressing   Problem: Activity: Goal: Risk for activity intolerance will decrease Outcome: Progressing   Problem: Nutrition: Goal: Adequate nutrition will be maintained Outcome: Progressing   Problem: Coping: Goal: Level of anxiety will decrease Outcome: Progressing   Problem: Elimination: Goal: Will not experience complications related to bowel motility Outcome: Progressing Goal: Will not experience complications related to urinary retention Outcome: Progressing   Problem: Pain Managment: Goal: General experience of comfort will improve Outcome: Progressing   Problem: Safety: Goal: Ability to remain free from injury will improve Outcome: Progressing   Problem: Skin Integrity: Goal: Risk for impaired skin integrity will decrease Outcome: Progressing   Problem: Education: Goal: Verbalization of understanding the information provided (i.e., activity precautions, restrictions, etc) will improve Outcome: Progressing Goal: Individualized Educational Video(s) Outcome: Progressing   Problem: Activity: Goal: Ability to ambulate and perform ADLs will improve Outcome: Progressing   Problem: Clinical Measurements: Goal: Postoperative complications will be avoided or minimized Outcome: Progressing   Problem: Self-Concept: Goal: Ability to maintain and perform role responsibilities to the fullest extent possible will improve Outcome: Progressing   Problem: Pain Management: Goal: Pain level will decrease Outcome: Progressing   

## 2022-05-06 NOTE — Progress Notes (Signed)
Physical Therapy Treatment Patient Details Name: Krystal Sanchez MRN: [id number] DOB: [birth year] Today's Date: 05/06/2022   History of Present Illness 81 y/o female s/p fall with L hip fracture; underwent hemiarthoplasty 7/17.    PT Comments    Pt continues to display some anxious hesitancy with essentially all acts with PT but never the less shows good effort.  She was able to do more ambulation today than any prior sessions, initially very limited but ultimately advancing to the point of near step-through advancing R with appropriate (heavy) UE use.  She continues to have expected pain in L hip but with encouragement from PT and family she was able to push herself and continue to make slow but steady gains.    Recommendations for follow up therapy are one component of a multi-disciplinary discharge planning process, led by the attending physician.  Recommendations may be updated based on patient status, additional functional criteria and insurance authorization.  Follow Up Recommendations  Skilled nursing-short term rehab (<3 hours/day) Can patient physically be transported by private vehicle: No   Assistance Recommended at Discharge Frequent or constant Supervision/Assistance  Patient can return home with the following A lot of help with walking and/or transfers;A little help with bathing/dressing/bathroom;Assistance with cooking/housework;Assist for transportation;Help with stairs or ramp for entrance   Equipment Recommendations  Rolling walker (2 wheels)    Recommendations for Other Services       Precautions / Restrictions Precautions Precautions: Fall Restrictions Weight Bearing Restrictions: Yes LLE Weight Bearing: Partial weight bearing LLE Partial Weight Bearing Percentage or Pounds: 25%     Mobility  Bed Mobility               General bed mobility comments: in recliner pre and post session    Transfers Overall transfer level: Needs assistance Equipment  used: Rolling walker (2 wheels) Transfers: Sit to/from Stand Sit to Stand: Min assist           General transfer comment: Pt remains unable to rise on her own from standard height recliner with b/l arm rests, ultimately needed considerable assist to get weight shifting up and forward    Ambulation/Gait Ambulation/Gait assistance: Min assist Gait Distance (Feet): 18 Feet Assistive device: Rolling walker (2 wheels)         General Gait Details: Pt initially unable to clear R foot needing a side to side shimmy to inch it forward, after a few initially very hesitant and guarded steps and plenty of cuing she did show improved ability to use UEs to unweight and clear foot.  Pt fatigued but O2 did remain in the 90s, HR up to ~100 with the effort   Stairs             Wheelchair Mobility    Modified Rankin (Stroke Patients Only)       Balance Overall balance assessment: Needs assistance Sitting-balance support: No upper extremity supported Sitting balance-Leahy Scale: Good Sitting balance - Comments: Pt leaning away from L hip but able to maintain sitting relatively confidently   Standing balance support: Bilateral upper extremity supported Standing balance-Leahy Scale: Fair Standing balance comment: statically, poor balance with heavy UE need dynamically                            Cognition Arousal/Alertness: Awake/alert Behavior During Therapy: Anxious Overall Cognitive Status: Within Functional Limits for tasks assessed  Exercises Total Joint Exercises Ankle Circles/Pumps: AROM, 10 reps Quad Sets: Strengthening, 10 reps Hip ABduction/ADduction: AAROM, 10 reps Long Arc Quad: 10 reps, AROM Knee Flexion: Strengthening, 10 reps, AROM Marching in Standing: AROM, 10 reps, Seated    General Comments        Pertinent Vitals/Pain Pain Assessment Pain Assessment: 0-10 Pain Score: 7  Pain  Location: per her usual minimal pain at rest that increases with any movement of L LE    Home Living                          Prior Function            PT Goals (current goals can now be found in the care plan section) Progress towards PT goals: Progressing toward goals    Frequency    BID      PT Plan Current plan remains appropriate    Co-evaluation              AM-PAC PT "6 Clicks" Mobility   Outcome Measure  Help needed turning from your back to your side while in a flat bed without using bedrails?: A Little Help needed moving from lying on your back to sitting on the side of a flat bed without using bedrails?: A Lot Help needed moving to and from a bed to a chair (including a wheelchair)?: A Lot Help needed standing up from a chair using your arms (e.g., wheelchair or bedside chair)?: A Lot Help needed to walk in hospital room?: A Lot Help needed climbing 3-5 steps with a railing? : Total 6 Click Score: 12    End of Session Equipment Utilized During Treatment: Gait belt Activity Tolerance: Patient limited by fatigue;Patient limited by pain Patient left: in chair;with call bell/phone within reach;with family/visitor present;with nursing/sitter in room Nurse Communication: Mobility status;Weight bearing status PT Visit Diagnosis: Muscle weakness (generalized) (M62.81);Difficulty in walking, not elsewhere classified (R26.2);Pain Pain - Right/Left: Left Pain - part of body: Hip     Time: 0737-1062 PT Time Calculation (min) (ACUTE ONLY): 28 min  Charges:  $Gait Training: 8-22 mins $Therapeutic Exercise: 8-22 mins                     Malachi Pro, DPT 05/06/2022, 12:00 PM

## 2022-05-06 NOTE — Progress Notes (Signed)
PROGRESS NOTE   HPI was taken from Dr. Marcello Moores: Krystal Sanchez is a 81 y.o. female with medical history significant of  HTN, hypothyroidism ,arthritis Knee recent interim hx of cortisone shot  who had trip and fall down stairs landing on concrete with immediate severe left hip pain. EMS was called and brought patient to ED. Patient notes she was in her normal state of health prior to fall.  ON ros she denies chest pain , n/v/d/dysuria / sob /fever/ or chills.  She currently does state that pain is uncontrolled. Per rn patient has gotten q1 h fentanyl w/o much relief. Patient does not take pain medication at home.   ED Course:  Afeb, bp 131/112, hr 112, rr 20 , sat 98% on ra     Wbc 18.3, hgb 13, plt 264 NA:138, K 3.8, CL 110, bicarb 21, Glu 153, cr 1.15   CTH IMPRESSION: 1. No acute intracranial process. 2. 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 3. Left frontal lobe encephalomalacia consistent with prior postsurgical or chronic ischemic change.   CT Pelvis IMPRESSION: 1. Impacted subcapital left femoral neck fracture with ventral and varus angulation at the fracture site. 2. 9.1 cm simple appearing left adnexal cyst, increased since prior ultrasound 09/16/2020 where it had measured up to 7.5 cm. If not previously performed, and if the patient would be a therapy candidate should neoplasm be detected, nonemergent outpatient MRI follow-up or gynecologic surgical consultation may be useful.   Cxr: 1.Borderline mild cardiomegaly likely accentuated by technique. 2. Low lung volumes.  Peribronchial thickening of unknown acuity. Tx fentanyl 54mcg      Krystal Sanchez  [id number] DOB: 16-Sep-1941 DOA: 05/03/2022 PCP: Kirk Ruths, MD   Assessment & Plan:   Principal Problem:   Hip fx [facility]) Active Problems:   Hip fracture ([place])   Hypothyroid   Benign hypertension with CKD (chronic kidney disease) stage III ([place])    Osteoporosis  Assessment and Plan: Acute impacted subcapital left femoral neck fracture: s/p mechanical fall. S/p left hip hemiarthroplasty on 05/04/22 as per ortho surg. Norco, dilaudid prn for pain. Ortho surg recs apprec. Wants aspirin bid for outpt ppx, f/u 2 wks. For SNF, bed search ongoing   Elevated troponins: minimal. Likely secondary to demand ischemia. Echo with normal ef, grade 1 dd.  No further cardiac inpatient work-up recommended as per cardio    Enlarging Ovarian Cyst: will need MRI outpatient and possible gyn surg f/u pt wants to pursue  HTN: controlled; continue on losartan, HCTZ   Hypothyroidism: continue on home dose of synthroid   Nausea/vomiting: this morning, resolved. No chest pain. Had some gerd symptoms so will start ppi. Will hold iron, minimize use of opioids       DVT prophylaxis: lovenox Code Status: full  Family Communication: daughters updated @ bedside 7/19 Disposition Plan:  SNF, bed search underway  Level of care: Med-Surg  Status is: Inpatient Remains inpatient appropriate because: severity of illness   Consultants:  Ortho surg Cardio   Procedures:   Antimicrobials:    Subjective: Pt still of left hip pain   Objective: Vitals:   05/06/22 0533 05/06/22 0810 05/06/22 0929 05/06/22 1123  BP: (!) 134/57 (!) 101/51 (!) 130/53 (!) 128/56  Pulse: 88 (!) 53 80 91  Resp: 18   18  Temp: 98.3 F (36.8 C) 98.3 F (36.8 C)  98.3 F (36.8 C)  TempSrc:  Oral  Oral  SpO2: 99% 99%  100%  Weight:      Height:       No intake or output data in the 24 hours ending 05/06/22 1425  Filed Weights   05/03/22 1957 05/04/22 1309  Weight: 63.5 kg 63.5 kg    Examination:  General exam: Appears calm & comfortable  Respiratory system: clear breath sounds b/l  Cardiovascular system: S1/S2+. No rubs or gallops Gastrointestinal system: Abd is soft, NT, ND & normal bowel sounds  Central nervous system:  alert and oriented. Moves all  extremities Psychiatry: judgement and insight appear at baseline. Appropriate mood and affect  MSK left hip bandage c/d/i   Data Reviewed: I have personally reviewed following labs and imaging studies  CBC: Recent Labs  Lab 05/03/22 2015 05/04/22 0523 05/05/22 0423 05/06/22 0413  WBC 18.3* 11.3* 11.6* 10.0  HGB 13.0 12.6 12.4 11.9*  HCT 40.3 39.5 38.2 36.8  MCV 96.4 97.3 96.2 96.3  PLT 264 211 188 173   Basic Metabolic Panel: Recent Labs  Lab 05/03/22 2015 05/03/22 2239 05/04/22 0523 05/05/22 0423 05/06/22 0413  NA 138  --  140 136 137  K 3.8  --  3.6 4.2 4.0  CL 110  --  114* 112* 111  CO2 21*  --  20* 22 25  GLUCOSE 153*  --  131* 151* 124*  BUN 31*  --  21 16 19   CREATININE 1.15*  --  0.85 0.87 0.91  CALCIUM 8.8* 8.3* 8.2* 8.4* 8.3*   GFR: Estimated Creatinine Clearance: 41.9 mL/min (by C-G formula based on SCr of 0.91 mg/dL). Liver Function Tests: Recent Labs  Lab 05/03/22 2015  AST 16  ALT 20  ALKPHOS 64  BILITOT 0.9  PROT 6.3*  ALBUMIN 3.9   No results for input(s): "LIPASE", "AMYLASE" in the last 168 hours. No results for input(s): "AMMONIA" in the last 168 hours. Coagulation Profile: Recent Labs  Lab 05/03/22 2239  INR 1.1   Cardiac Enzymes: No results for input(s): "CKTOTAL", "CKMB", "CKMBINDEX", "TROPONINI" in the last 168 hours. BNP (last 3 results) No results for input(s): "PROBNP" in the last 8760 hours. HbA1C: No results for input(s): "HGBA1C" in the last 72 hours. CBG: No results for input(s): "GLUCAP" in the last 168 hours. Lipid Profile: No results for input(s): "CHOL", "HDL", "LDLCALC", "TRIG", "CHOLHDL", "LDLDIRECT" in the last 72 hours. Thyroid Function Tests: No results for input(s): "TSH", "T4TOTAL", "FREET4", "T3FREE", "THYROIDAB" in the last 72 hours. Anemia Panel: No results for input(s): "VITAMINB12", "FOLATE", "FERRITIN", "TIBC", "IRON", "RETICCTPCT" in the last 72 hours. Sepsis Labs: No results for input(s):  "PROCALCITON", "LATICACIDVEN" in the last 168 hours.  No results found for this or any previous visit (from the past 240 hour(s)).       Radiology Studies: ECHOCARDIOGRAM COMPLETE  Result Date: 05/05/2022    ECHOCARDIOGRAM REPORT   Patient Name:   Krystal Sanchez Date of Exam: 05/05/2022 Medical Rec #:  05/07/2022       Height:       64.0 in Accession #:    [id number]      Weight:       140.0 lb Date of Birth:  10-08-1941      BSA:          1.681 m Patient Age:    80 years        BP:           125/69 mmHg Patient Gender: F  HR:           86 bpm. Exam Location:  [facility] Procedure: 2D Echo, Color Doppler and Cardiac Doppler Indications:     Abnormal ECG R94.31  History:         Patient has no prior history of Echocardiogram examinations.                  Risk Factors:Hypertension.  Sonographer:     Cristela Blue Referring Phys:  [id number] HOWARD MILLER Diagnosing Phys: Alwyn Pea MD  Sonographer Comments: Suboptimal apical window. IMPRESSIONS  1. Left ventricular ejection fraction, by estimation, is 50 to 55%. The left ventricle has low normal function. The left ventricle has no regional wall motion abnormalities. There is mild left ventricular hypertrophy. Left ventricular diastolic parameters are consistent with Grade I diastolic dysfunction (impaired relaxation).  2. Right ventricular systolic function is normal. The right ventricular size is normal.  3. The mitral valve is normal in structure. Trivial mitral valve regurgitation.  4. The aortic valve is normal in structure. Aortic valve regurgitation is not visualized. FINDINGS  Left Ventricle: Left ventricular ejection fraction, by estimation, is 50 to 55%. The left ventricle has low normal function. The left ventricle has no regional wall motion abnormalities. The left ventricular internal cavity size was normal in size. There is mild left ventricular hypertrophy. Left ventricular diastolic parameters are consistent with Grade I diastolic  dysfunction (impaired relaxation). Right Ventricle: The right ventricular size is normal. No increase in right ventricular wall thickness. Right ventricular systolic function is normal. Left Atrium: Left atrial size was normal in size. Right Atrium: Right atrial size was normal in size. Pericardium: There is no evidence of pericardial effusion. Mitral Valve: The mitral valve is normal in structure. Trivial mitral valve regurgitation. Tricuspid Valve: The tricuspid valve is normal in structure. Tricuspid valve regurgitation is trivial. Aortic Valve: The aortic valve is normal in structure. Aortic valve regurgitation is not visualized. Aortic valve mean gradient measures 3.7 mmHg. Aortic valve peak gradient measures 5.8 mmHg. Aortic valve area, by VTI measures 1.55 cm. Pulmonic Valve: The pulmonic valve was normal in structure. Pulmonic valve regurgitation is trivial. Aorta: The ascending aorta was not well visualized. IAS/Shunts: No atrial level shunt detected by color flow Doppler.  LEFT VENTRICLE PLAX 2D LVIDd:         3.10 cm   Diastology LVIDs:         2.30 cm   LV e' medial:    4.35 cm/s LV PW:         0.90 cm   LV E/e' medial:  13.6 LV IVS:        1.30 cm   LV e' lateral:   6.64 cm/s LVOT diam:     1.90 cm   LV E/e' lateral: 8.9 LV SV:         32 LV SV Index:   19 LVOT Area:     2.84 cm  RIGHT VENTRICLE RV Basal diam:  3.20 cm RV S prime:     12.30 cm/s TAPSE (M-mode): 1.8 cm LEFT ATRIUM           Index        RIGHT ATRIUM           Index LA diam:      2.60 cm 1.55 cm/m   RA Area:     10.90 cm LA Vol (A2C): 8.5 ml  5.04 ml/m   RA Volume:   24.50 ml  14.57 ml/m LA Vol (A4C): 18.9 ml 11.24 ml/m  AORTIC VALVE AV Area (Vmax):    1.60 cm AV Area (Vmean):   1.43 cm AV Area (VTI):     1.55 cm AV Vmax:           120.67 cm/s AV Vmean:          86.067 cm/s AV VTI:            0.207 m AV Peak Grad:      5.8 mmHg AV Mean Grad:      3.7 mmHg LVOT Vmax:         67.90 cm/s LVOT Vmean:        43.400 cm/s LVOT VTI:           0.113 m LVOT/AV VTI ratio: 0.55  AORTA Ao Root diam: 2.60 cm MITRAL VALVE               TRICUSPID VALVE MV Area (PHT): 5.88 cm    TR Peak grad:   32.5 mmHg MV Decel Time: 129 msec    TR Vmax:        285.00 cm/s MV E velocity: 59.10 cm/s MV A velocity: 81.40 cm/s  SHUNTS MV E/A ratio:  0.73        Systemic VTI:  0.11 m                            Systemic Diam: 1.90 cm Yolonda Kida MD Electronically signed by Yolonda Kida MD Signature Date/Time: 05/05/2022/5:02:22 PM    Final    DG Hip Port Unilat With Pelvis 1V Left  Result Date: 05/04/2022 CLINICAL DATA:  Status post left total hip arthroplasty. EXAM: DG HIP (WITH OR WITHOUT PELVIS) 1V PORT LEFT COMPARISON:  05/03/2022 CT pelvis FINDINGS: Status post interval left total hip arthroplasty. No periprosthetic lucency or fracture. Near anatomic alignment of the prosthetic components. Air within the soft tissues is not unexpected postoperatively. Superficial skin staples. No acute abnormality in the remainder of the pelvis or right hip. IMPRESSION: Expected postoperative appearance, status post left total hip arthroplasty. Electronically Signed   By: Merilyn Baba M.D.   On: 05/04/2022 17:02        Scheduled Meds:  atorvastatin  40 mg Oral q1800   chlorhexidine   Topical Once   docusate sodium  100 mg Oral BID   enoxaparin (LOVENOX) injection  30 mg Subcutaneous Q24H   feeding supplement  237 mL Oral BID BM   losartan  100 mg Oral Daily   And   hydrochlorothiazide  12.5 mg Oral Daily   levothyroxine  75 mcg Oral Q0600   multivitamin with minerals  1 tablet Oral Daily   [START ON 05/07/2022] pantoprazole  40 mg Oral Daily   Vitamin D (Ergocalciferol)  50,000 Units Oral Q7 days   Continuous Infusions:  sodium chloride Stopped (05/05/22 0621)   methocarbamol (ROBAXIN) IV       LOS: 3 days    Desma Maxim, MD [facility]  If 7PM-7AM, please contact night-coverage [email or web address] 05/06/2022, 2:25 PM

## 2022-05-06 NOTE — Care Management Important Message (Signed)
Important Message  Patient Details  Name: Krystal Sanchez MRN: [id number] Date of Birth: [birth year]   Medicare Important Message Given:  Yes     Krystal Sanchez 05/06/2022, 11:19 AM

## 2022-05-07 DIAGNOSIS — S72009A Fracture of unspecified part of neck of unspecified femur, initial encounter for closed fracture: Secondary | ICD-10-CM | POA: Diagnosis not present

## 2022-05-07 LAB — CBC
HCT: 33.5 % — ABNORMAL LOW (ref 36.0–46.0)
Hemoglobin: 11 g/dL — ABNORMAL LOW (ref 12.0–15.0)
MCH: 31 pg (ref 26.0–34.0)
MCHC: 32.8 g/dL (ref 30.0–36.0)
MCV: 94.4 fL (ref 80.0–100.0)
Platelets: 193 10*3/uL (ref 150–400)
RBC: 3.55 MIL/uL — ABNORMAL LOW (ref 3.87–5.11)
RDW: 12.5 % (ref 11.5–15.5)
WBC: 8 10*3/uL (ref 4.0–10.5)
nRBC: 0 % (ref 0.0–0.2)

## 2022-05-07 LAB — BASIC METABOLIC PANEL
Anion gap: 3 — ABNORMAL LOW (ref 5–15)
BUN: 19 mg/dL (ref 8–23)
CO2: 26 mmol/L (ref 22–32)
Calcium: 8.3 mg/dL — ABNORMAL LOW (ref 8.9–10.3)
Chloride: 110 mmol/L (ref 98–111)
Creatinine, Ser: 0.79 mg/dL (ref 0.44–1.00)
GFR, Estimated: >60 mL/min (ref >60)
Glucose, Bld: 103 mg/dL — ABNORMAL HIGH (ref 70–99)
Potassium: 3.9 mmol/L (ref 3.5–5.1)
Sodium: 139 mmol/L (ref 135–145)

## 2022-05-07 MED ORDER — ASPIRIN 81 MG PO TBEC: 81.0000 mg | DELAYED_RELEASE_TABLET | Freq: Two times a day (BID) | ORAL | 0 refills | Status: AC

## 2022-05-07 MED ORDER — SENNOSIDES-DOCUSATE SODIUM 8.6-50 MG PO TABS
1.0000 | ORAL_TABLET | Freq: Every evening | ORAL | 1 refills | Status: AC | PRN
Start: 2022-05-07 — End: ?

## 2022-05-07 MED ORDER — HYDROCODONE-ACETAMINOPHEN 5-325 MG PO TABS: 0.5000 | ORAL_TABLET | ORAL | 0 refills | Status: AC | PRN

## 2022-05-07 NOTE — TOC Progression Note (Signed)
Transition of Care [facility]) - Progression Note    Patient Details  Name: Krystal Sanchez MRN: [id number] Date of Birth: [birth year]  [facility] CM/SW Contact  Truddie Hidden, RN Phone Number: 05/07/2022, 12:52 PM  Clinical Narrative:    Discharge order received. Facility confirmed admission for today and provided room assignment. Family, and nurse notified. Nurse provided with room assignment and where to call report. Discharge summary and transfer summary sent in hub. Face sheet and medical necessity form printed to floor to add to EMS packet. TOC signing off.    Expected Discharge Plan:  (TBD) Barriers to Discharge: Continued Medical Work up  Expected Discharge Plan and Services Expected Discharge Plan:  (TBD)       Living arrangements for the past 2 months: Single Family Home Expected Discharge Date: 05/07/22                                     Social Determinants of Health (SDOH) Interventions    Readmission Risk Interventions     No data to display

## 2022-05-07 NOTE — Discharge Summary (Signed)
Krystal Sanchez [id number] DOB: 1941/09/17 DOA: 05/03/2022  PCP: Kirk Ruths, MD  Admit date: 05/03/2022 Discharge date: 05/07/2022  Time spent: 35 minutes  Recommendations for Outpatient Follow-up:  Orthopedics f/u 2 weeks  2. Consider outpatient treatment of osteoporosis 3. Consider outpatient MRI of pelvis or gynecology f/u   Discharge Diagnoses:  Principal Problem:   Hip fx ([place]) Active Problems:   Hip fracture ([place])   Hypothyroid   Benign hypertension with CKD (chronic kidney disease) stage III ([place])   Osteoporosis   Discharge Condition: stable  Diet recommendation: regular  Filed Weights   05/03/22 1957 05/04/22 1309  Weight: 63.5 kg 63.5 kg    History of present illness:  From admission h and p Krystal Sanchez is a 81 y.o. female with medical history significant of  HTN, hypothyroidism ,arthritis Knee recent interim hx of cortisone shot  who had trip and fall down stairs landing on concrete with immediate severe left hip pain. EMS was called and brought patient to ED. Patient notes she was in her normal state of health prior to fall.  ON ros she denies chest pain , n/v/d/dysuria / sob /fever/ or chills.  She currently does state that pain is uncontrolled. Per rn patient has gotten q1 h fentanyl w/o much relief. Patient does not take pain medication at home.  Hospital Course:  Acute impacted subcapital left femoral neck fracture: s/p mechanical fall. S/p left hip hemiarthroplasty on 05/04/22 w/ ortho surg. F/u 2 weeks, ortho advises asa for dvt ppx  2 weeks   Elevated troponins: minimal. Likely secondary to demand ischemia. Echo with normal ef, grade 1 dd.  No further cardiac inpatient work-up recommended as per cardiology   HTN: controlled; continue on losartan, HCTZ   Procedures: See above   Consultations: orthopedics  Discharge Exam: Vitals:   05/07/22 0328 05/07/22 0900  BP: (!) 114/52 123/63  Pulse: 89 88  Resp: 18 18  Temp: 98.1 F (36.7  C) 98 F (36.7 C)  SpO2: 98% 99%    General exam: Appears calm & comfortable  Respiratory system: clear breath sounds b/l  Cardiovascular system: S1/S2+. No rubs or gallops Gastrointestinal system: Abd is soft, NT, ND & normal bowel sounds  Central nervous system:  alert and oriented. Moves all extremities Psychiatry: judgement and insight appear at baseline. Appropriate mood and affect  MSK left hip bandage c/d/i  Discharge Instructions   Discharge Instructions     Diet - low sodium heart healthy   Complete by: As directed    Increase activity slowly   Complete by: As directed    Leave dressing on - Keep it clean, dry, and intact until clinic visit   Complete by: As directed       Allergies as of 05/07/2022   No Known Allergies      Medication List     TAKE these medications    aspirin EC 81 MG tablet Take 1 tablet (81 mg total) by mouth 2 (two) times daily for 14 days. Swallow whole.   atorvastatin 40 MG tablet Commonly known as: LIPITOR Take 40 mg by mouth daily.   HYDROcodone-acetaminophen 5-325 MG tablet Commonly known as: NORCO/VICODIN Take 0.5 tablets by mouth every 4 (four) hours as needed for moderate pain.   levothyroxine 75 MCG tablet Commonly known as: SYNTHROID Take 75 mcg by mouth every morning.   losartan-hydrochlorothiazide 100-12.5 MG tablet Commonly known as: HYZAAR Take 1 tablet by mouth daily.   omeprazole 20 MG capsule  Commonly known as: PRILOSEC Take 20 mg by mouth daily as needed.   potassium chloride 10 MEQ tablet Commonly known as: KLOR-CON Take 10 mEq by mouth daily.   senna-docusate 8.6-50 MG tablet Commonly known as: Senokot-S Take 1 tablet by mouth at bedtime as needed for mild constipation.   Vitamin D (Ergocalciferol) 1.25 MG (50000 UNIT) Caps capsule Commonly known as: DRISDOL TAKE 1 CAPSULE BY MOUTH ONCE A WEEK AS DIRECTED               Discharge Care Instructions  (From admission, onward)            Start     Ordered   05/07/22 0000  Leave dressing on - Keep it clean, dry, and intact until clinic visit        05/07/22 1229           No Known Allergies  Follow-up Information     Earnestine Leys, MD Follow up in 2 week(s).   Specialty: Orthopedic Surgery Why: Please make appointment for patient prior to discharge Contact information: [place] [address] [phone]         Kirk Ruths, MD Follow up.   Specialty: Internal Medicine Contact information: [address] [place] [address] [phone]                  The results of significant diagnostics from this hospitalization (including imaging, microbiology, ancillary and laboratory) are listed below for reference.    Significant Diagnostic Studies: ECHOCARDIOGRAM COMPLETE  Result Date: 05/05/2022    ECHOCARDIOGRAM REPORT   Patient Name:   Krystal Sanchez Date of Exam: 05/05/2022 Medical Rec #:  [id number]       Height:       64.0 in Accession #:    [id number]      Weight:       140.0 lb Date of Birth:  03-31-1941      BSA:          1.681 m Patient Age:    42 years        BP:           125/69 mmHg Patient Gender: F               HR:           86 bpm. Exam Location:  [facility] Procedure: 2D Echo, Color Doppler and Cardiac Doppler Indications:     Abnormal ECG R94.31  History:         Patient has no prior history of Echocardiogram examinations.                  Risk Factors:Hypertension.  Sonographer:     Sherrie Sport Referring Phys:  [place] Diagnosing Phys: Yolonda Kida MD  Sonographer Comments: Suboptimal apical window. IMPRESSIONS  1. Left ventricular ejection fraction, by estimation, is 50 to 55%. The left ventricle has low normal function. The left ventricle has no regional wall motion abnormalities. There is mild left ventricular hypertrophy. Left ventricular diastolic parameters are consistent with Grade I diastolic dysfunction  (impaired relaxation).  2. Right ventricular systolic function is normal. The right ventricular size is normal.  3. The mitral valve is normal in structure. Trivial mitral valve regurgitation.  4. The aortic valve is normal in structure. Aortic valve regurgitation is not visualized. FINDINGS  Left Ventricle: Left ventricular ejection fraction, by estimation, is 50 to 55%.  The left ventricle has low normal function. The left ventricle has no regional wall motion abnormalities. The left ventricular internal cavity size was normal in size. There is mild left ventricular hypertrophy. Left ventricular diastolic parameters are consistent with Grade I diastolic dysfunction (impaired relaxation). Right Ventricle: The right ventricular size is normal. No increase in right ventricular wall thickness. Right ventricular systolic function is normal. Left Atrium: Left atrial size was normal in size. Right Atrium: Right atrial size was normal in size. Pericardium: There is no evidence of pericardial effusion. Mitral Valve: The mitral valve is normal in structure. Trivial mitral valve regurgitation. Tricuspid Valve: The tricuspid valve is normal in structure. Tricuspid valve regurgitation is trivial. Aortic Valve: The aortic valve is normal in structure. Aortic valve regurgitation is not visualized. Aortic valve mean gradient measures 3.7 mmHg. Aortic valve peak gradient measures 5.8 mmHg. Aortic valve area, by VTI measures 1.55 cm. Pulmonic Valve: The pulmonic valve was normal in structure. Pulmonic valve regurgitation is trivial. Aorta: The ascending aorta was not well visualized. IAS/Shunts: No atrial level shunt detected by color flow Doppler.  LEFT VENTRICLE PLAX 2D LVIDd:         3.10 cm   Diastology LVIDs:         2.30 cm   LV e' medial:    4.35 cm/s LV PW:         0.90 cm   LV E/e' medial:  13.6 LV IVS:        1.30 cm   LV e' lateral:   6.64 cm/s LVOT diam:     1.90 cm   LV E/e' lateral: 8.9 LV SV:         32 LV SV Index:    19 LVOT Area:     2.84 cm  RIGHT VENTRICLE RV Basal diam:  3.20 cm RV S prime:     12.30 cm/s TAPSE (M-mode): 1.8 cm LEFT ATRIUM           Index        RIGHT ATRIUM           Index LA diam:      2.60 cm 1.55 cm/m   RA Area:     10.90 cm LA Vol (A2C): 8.5 ml  5.04 ml/m   RA Volume:   24.50 ml  14.57 ml/m LA Vol (A4C): 18.9 ml 11.24 ml/m  AORTIC VALVE AV Area (Vmax):    1.60 cm AV Area (Vmean):   1.43 cm AV Area (VTI):     1.55 cm AV Vmax:           120.67 cm/s AV Vmean:          86.067 cm/s AV VTI:            0.207 m AV Peak Grad:      5.8 mmHg AV Mean Grad:      3.7 mmHg LVOT Vmax:         67.90 cm/s LVOT Vmean:        43.400 cm/s LVOT VTI:          0.113 m LVOT/AV VTI ratio: 0.55  AORTA Ao Root diam: 2.60 cm MITRAL VALVE               TRICUSPID VALVE MV Area (PHT): 5.88 cm    TR Peak grad:   32.5 mmHg MV Decel Time: 129 msec    TR Vmax:        285.00 cm/s MV E velocity:  59.10 cm/s MV A velocity: 81.40 cm/s  SHUNTS MV E/A ratio:  0.73        Systemic VTI:  0.11 m                            Systemic Diam: 1.90 cm Yolonda Kida MD Electronically signed by Yolonda Kida MD Signature Date/Time: 05/05/2022/5:02:22 PM    Final    DG Hip Port Unilat With Pelvis 1V Left  Result Date: 05/04/2022 CLINICAL DATA:  Status post left total hip arthroplasty. EXAM: DG HIP (WITH OR WITHOUT PELVIS) 1V PORT LEFT COMPARISON:  05/03/2022 CT pelvis FINDINGS: Status post interval left total hip arthroplasty. No periprosthetic lucency or fracture. Near anatomic alignment of the prosthetic components. Air within the soft tissues is not unexpected postoperatively. Superficial skin staples. No acute abnormality in the remainder of the pelvis or right hip. IMPRESSION: Expected postoperative appearance, status post left total hip arthroplasty. Electronically Signed   By: Merilyn Baba M.D.   On: 05/04/2022 17:02   DG Chest Portable 1 View  Result Date: 05/03/2022 CLINICAL DATA:  Preop.  Fall.  Left hip fracture. EXAM:  PORTABLE CHEST 1 VIEW COMPARISON:  None Available. FINDINGS: Lung volumes are low. Borderline mild cardiomegaly likely accentuated by technique. Mild peribronchial thickening of unknown acuity. No pneumothorax or large pleural effusion. No acute rib fractures. IMPRESSION: 1. Borderline mild cardiomegaly likely accentuated by technique. 2. Low lung volumes.  Peribronchial thickening of unknown acuity. Electronically Signed   By: Keith Rake M.D.   On: 05/03/2022 21:36   CT PELVIS WO CONTRAST  Result Date: 05/03/2022 CLINICAL DATA:  [place] downstairs, left hip pain EXAM: CT PELVIS WITHOUT CONTRAST TECHNIQUE: Multidetector CT imaging of the pelvis was performed following the standard protocol without intravenous contrast. RADIATION DOSE REDUCTION: This exam was performed according to the departmental dose-optimization program which includes automated exposure control, adjustment of the mA and/or kV according to patient size and/or use of iterative reconstruction technique. COMPARISON:  None Available. FINDINGS: Urinary Tract:  Distal ureters and bladder are grossly unremarkable. Bowel: No bowel obstruction or ileus. No bowel wall thickening or inflammatory change. Vascular/Lymphatic: Atherosclerosis of the aorta and its distal branches. No pathologic adenopathy. Reproductive: There is a 9.1 x 6.2 cm simple appearing left adnexal cyst again noted, previously having measured up to 7.5 cm. If not previously evaluated, and if the patient would be a therapy candidate should neoplasm be detected, MRI follow-up may be useful. Uterus is atrophic. Right adnexa is unremarkable. Other: No free fluid or free intraperitoneal gas. No abdominal wall hernia. Musculoskeletal: There is an acute impacted subcapital left femoral neck fracture, with varus and ventral angulation at the fracture site. No dislocation. The remainder of the visualized bony pelvis is unremarkable. Symmetrical bilateral hip osteoarthritis. Reconstructed  images demonstrate no additional findings. IMPRESSION: 1. Impacted subcapital left femoral neck fracture with ventral and varus angulation at the fracture site. 2. 9.1 cm simple appearing left adnexal cyst, increased since prior ultrasound 09/16/2020 where it had measured up to 7.5 cm. If not previously performed, and if the patient would be a therapy candidate should neoplasm be detected, nonemergent outpatient MRI follow-up or gynecologic surgical consultation may be useful. 3.  Aortic Atherosclerosis (ICD10-I70.0). Electronically Signed   By: Randa Ngo M.D.   On: 05/03/2022 21:02   CT HEAD WO CONTRAST (5MM)  Result Date: 05/03/2022 CLINICAL DATA:  [place] down stairs, left hip pain EXAM: CT  HEAD WITHOUT CONTRAST TECHNIQUE: Contiguous axial images were obtained from the base of the skull through the vertex without intravenous contrast. RADIATION DOSE REDUCTION: This exam was performed according to the departmental dose-optimization program which includes automated exposure control, adjustment of the mA and/or kV according to patient size and/or use of iterative reconstruction technique. COMPARISON:  None Available. FINDINGS: Brain: Left frontal lobe encephalomalacia consistent with prior infarct or chronic postsurgical change. Hypodensities throughout the periventricular white matter and basal ganglia most consistent with chronic small vessel ischemic changes. No other signs of acute infarct or hemorrhage. Lateral ventricles and remaining midline structures are unremarkable. No acute extra-axial fluid collections. No mass effect. Vascular: No hyperdense vessel or unexpected calcification. Skull: Prior left temporal craniotomy.  No acute bony abnormality. Sinuses/Orbits: No acute finding. Other: None. IMPRESSION: 1. No acute intracranial process. 2. 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 3. Left frontal lobe encephalomalacia consistent with prior postsurgical or  chronic ischemic change. Electronically Signed   By: Sharlet Salina M.D.   On: 05/03/2022 20:56    Microbiology: No results found for this or any previous visit (from the past 240 hour(s)).   Labs: Basic Metabolic Panel: Recent Labs  Lab 05/03/22 2015 05/03/22 2239 05/04/22 0523 05/05/22 0423 05/06/22 0413 05/07/22 0543  NA 138  --  140 136 137 139  K 3.8  --  3.6 4.2 4.0 3.9  CL 110  --  114* 112* 111 110  CO2 21*  --  20* 22 25 26   GLUCOSE 153*  --  131* 151* 124* 103*  BUN 31*  --  21 16 19 19   CREATININE 1.15*  --  0.85 0.87 0.91 0.79  CALCIUM 8.8* 8.3* 8.2* 8.4* 8.3* 8.3*   Liver Function Tests: Recent Labs  Lab 05/03/22 2015  AST 16  ALT 20  ALKPHOS 64  BILITOT 0.9  PROT 6.3*  ALBUMIN 3.9   No results for input(s): "LIPASE", "AMYLASE" in the last 168 hours. No results for input(s): "AMMONIA" in the last 168 hours. CBC: Recent Labs  Lab 05/03/22 2015 05/04/22 0523 05/05/22 0423 05/06/22 0413 05/07/22 0543  WBC 18.3* 11.3* 11.6* 10.0 8.0  HGB 13.0 12.6 12.4 11.9* 11.0*  HCT 40.3 39.5 38.2 36.8 33.5*  MCV 96.4 97.3 96.2 96.3 94.4  PLT 264 211 188 173 193   Cardiac Enzymes: No results for input(s): "CKTOTAL", "CKMB", "CKMBINDEX", "TROPONINI" in the last 168 hours. BNP: BNP (last 3 results) Recent Labs    05/03/22 2239  BNP 66.3    ProBNP (last 3 results) No results for input(s): "PROBNP" in the last 8760 hours.  CBG: No results for input(s): "GLUCAP" in the last 168 hours.     Signed:  05/05/22 MD.  [facility] 05/07/2022, 12:30 PM

## 2022-05-07 NOTE — Progress Notes (Signed)
Nutrition Follow-up  DOCUMENTATION CODES:   Non-severe (moderate) malnutrition in context of chronic illness  INTERVENTION:   -Continue MVI with minerals daily -Continue Ensure Enlive po BID, each supplement provides 350 kcal and 20 grams of protein  NUTRITION DIAGNOSIS:   Moderate Malnutrition related to chronic illness (hypothyroidism) as evidenced by mild fat depletion, moderate fat depletion, mild muscle depletion, moderate muscle depletion.  Ongoing  GOAL:   Patient will meet greater than or equal to 90% of their needs  Progressing   MONITOR:   PO intake, Supplement acceptance  REASON FOR ASSESSMENT:   Consult Assessment of nutrition requirement/status, Hip fracture protocol  ASSESSMENT:   Pt with medical history significant of   HTN, hypothyroidism ,arthritis Knee recent interim hx of cortisone shot  who had trip and fall down stairs landing on concrete with immediate severe left hip pain.  7/17- s/p Left   hip hemiarthroplasty with [employer] Accolade prosthesis  Reviewed I/O's: -60 ml x 24 hours and -1.2 L x 24 hours  UOP: 300 ml x 24 hours   Spoke with pt and daughters at bedside. Pt reports decreased appetite since surgery. She only consumed a few bites of muffin at breakfast and a few bites of dinner last night. Pt daughters reports intake was better at home, but portions have decreased over the past few years. Pt usually eats 3 meals per day (Breakfast: eggs and cereal; Lunch: sandwich; Dinner: meat, starch, and vegetable).   Per daughters, pt was hospitalized with frequent UTIs a few years ago and pt also recently had her thyroid medications adjusted, which they suspect has contributed to her weight loss. Her UBW is around 135# and they estimate she has lost about 15# over the past few years. She has gone down to 1 X to 14 in dress size.   Discussed importance of good meal and supplement intake to promote healing. Pt amenable to continue Ensure.   Plan to d/c  to SNF once medically stable.   Medications reviewed and include colace and vitamin D.   Labs reviewed.   NUTRITION - FOCUSED PHYSICAL EXAM:  Flowsheet Row Most Recent Value  Orbital Region Moderate depletion  Upper Arm Region Moderate depletion  Thoracic and Lumbar Region No depletion  Buccal Region Mild depletion  Temple Region Moderate depletion  Clavicle Bone Region Moderate depletion  Clavicle and Acromion Bone Region Moderate depletion  Scapular Bone Region Moderate depletion  Dorsal Hand Moderate depletion  Patellar Region No depletion  Anterior Thigh Region No depletion  Posterior Calf Region No depletion  Edema (RD Assessment) None  Hair Reviewed  Eyes Reviewed  Mouth Reviewed  Skin Reviewed  Nails Reviewed       Diet Order:   Diet Order             Diet - low sodium heart healthy           Diet regular Room service appropriate? Yes; Fluid consistency: Thin  Diet effective now                   EDUCATION NEEDS:   Education needs have been addressed  Skin:  Skin Assessment: Skin Integrity Issues: Skin Integrity Issues:: Incisions Incisions: closed lt hip  Last BM:  05/06/22  Height:   Ht Readings from Last 1 Encounters:  05/04/22 5\' 4"  (1.626 m)    Weight:   Wt Readings from Last 1 Encounters:  05/04/22 63.5 kg    Ideal Body Weight:  54.5 kg  BMI:  Body mass index is 24.03 kg/m.  Estimated Nutritional Needs:   Kcal:  1600-1800  Protein:  80-95 grams  Fluid:  > 1.6 L    Levada Schilling, RD, LDN, CDCES Registered Dietitian II Certified Diabetes Care and Education Specialist Please refer to [facility] for RD and/or RD on-call/weekend/after hours pager

## 2022-05-07 NOTE — TOC Progression Note (Signed)
Transition of Care [facility]) - Progression Note    Patient Details  Name: Krystal Sanchez MRN: [id number] Date of Birth: September 23, 1941  Transition of [facility]) CM/SW Contact  Truddie Hidden, RN Phone Number: 05/07/2022, 10:05 AM  Clinical Narrative:    [employer]arlot Gowda auth approved [id number]) from 7/20-7-24 for [employer].     Expected Discharge Plan:  (TBD) Barriers to Discharge: Continued Medical Work up  Expected Discharge Plan and Services Expected Discharge Plan:  (TBD)       Living arrangements for the past 2 months: Single Family Home                                       Social Determinants of Health (SDOH) Interventions    Readmission Risk Interventions     No data to display

## 2022-05-07 NOTE — Plan of Care (Signed)
  Problem: Education: Goal: Knowledge of General Education information will improve Description: Including pain rating scale, medication(s)/side effects and non-pharmacologic comfort measures Outcome: Adequate for Discharge   Problem: Health Behavior/Discharge Planning: Goal: Ability to manage health-related needs will improve Outcome: Adequate for Discharge   Problem: Clinical Measurements: Goal: Ability to maintain clinical measurements within normal limits will improve Outcome: Adequate for Discharge Goal: Will remain free from infection Outcome: Adequate for Discharge Goal: Diagnostic test results will improve Outcome: Adequate for Discharge Goal: Respiratory complications will improve Outcome: Adequate for Discharge Goal: Cardiovascular complication will be avoided Outcome: Adequate for Discharge   Problem: Activity: Goal: Risk for activity intolerance will decrease Outcome: Adequate for Discharge   Problem: Nutrition: Goal: Adequate nutrition will be maintained Outcome: Adequate for Discharge   Problem: Coping: Goal: Level of anxiety will decrease Outcome: Adequate for Discharge   Problem: Elimination: Goal: Will not experience complications related to bowel motility Outcome: Adequate for Discharge Goal: Will not experience complications related to urinary retention Outcome: Adequate for Discharge   Problem: Pain Managment: Goal: General experience of comfort will improve Outcome: Adequate for Discharge   Problem: Safety: Goal: Ability to remain free from injury will improve Outcome: Adequate for Discharge   Problem: Skin Integrity: Goal: Risk for impaired skin integrity will decrease Outcome: Adequate for Discharge   Problem: Education: Goal: Knowledge of General Education information will improve Description: Including pain rating scale, medication(s)/side effects and non-pharmacologic comfort measures Outcome: Adequate for Discharge   Problem: Health  Behavior/Discharge Planning: Goal: Ability to manage health-related needs will improve Outcome: Adequate for Discharge   Problem: Clinical Measurements: Goal: Ability to maintain clinical measurements within normal limits will improve Outcome: Adequate for Discharge Goal: Will remain free from infection Outcome: Adequate for Discharge Goal: Diagnostic test results will improve Outcome: Adequate for Discharge Goal: Respiratory complications will improve Outcome: Adequate for Discharge Goal: Cardiovascular complication will be avoided Outcome: Adequate for Discharge   Problem: Activity: Goal: Risk for activity intolerance will decrease Outcome: Adequate for Discharge   Problem: Nutrition: Goal: Adequate nutrition will be maintained Outcome: Adequate for Discharge   Problem: Coping: Goal: Level of anxiety will decrease Outcome: Adequate for Discharge   Problem: Elimination: Goal: Will not experience complications related to bowel motility Outcome: Adequate for Discharge Goal: Will not experience complications related to urinary retention Outcome: Adequate for Discharge   Problem: Pain Managment: Goal: General experience of comfort will improve Outcome: Adequate for Discharge   Problem: Safety: Goal: Ability to remain free from injury will improve Outcome: Adequate for Discharge   Problem: Skin Integrity: Goal: Risk for impaired skin integrity will decrease Outcome: Adequate for Discharge   Problem: Education: Goal: Verbalization of understanding the information provided (i.e., activity precautions, restrictions, etc) will improve Outcome: Adequate for Discharge Goal: Individualized Educational Video(s) Outcome: Adequate for Discharge   Problem: Activity: Goal: Ability to ambulate and perform ADLs will improve Outcome: Adequate for Discharge   Problem: Clinical Measurements: Goal: Postoperative complications will be avoided or minimized Outcome: Adequate for  Discharge   Problem: Self-Concept: Goal: Ability to maintain and perform role responsibilities to the fullest extent possible will improve Outcome: Adequate for Discharge   Problem: Pain Management: Goal: Pain level will decrease Outcome: Adequate for Discharge   

## 2022-05-07 NOTE — Progress Notes (Signed)
Occupational Therapy Treatment Patient Details Name: Krystal Sanchez MRN: [id number] DOB: 11-09-1940 Today's Date: 05/07/2022   History of present illness 81 y/o female s/p fall with L hip fracture; underwent hemiarthoplasty 7/17.   OT comments  Ms Ratti was seen for OT treatment on this date. Upon arrival to room pt seated in chair, family at bed side, agreeable to tx. Pt requires MIN A + RW for BSC t/f, tolerates ~15 ft mobility. MIN A pericare in sitting with lateral leans, MAX A manage underwear in standing. Pt making good progress toward goals, will continue to follow POC. Discharge recommendation remains appropriate.     Recommendations for follow up therapy are one component of a multi-disciplinary discharge planning process, led by the attending physician.  Recommendations may be updated based on patient status, additional functional criteria and insurance authorization.    Follow Up Recommendations  Skilled nursing-short term rehab (<3 hours/day)    Assistance Recommended at Discharge Frequent or constant Supervision/Assistance  Patient can return home with the following  A lot of help with walking and/or transfers;A lot of help with bathing/dressing/bathroom   Equipment Recommendations  BSC/3in1    Recommendations for Other Services      Precautions / Restrictions Precautions Precautions: Fall;Posterior Hip Precaution Booklet Issued: Yes (comment) Restrictions Weight Bearing Restrictions: Yes LLE Weight Bearing: Partial weight bearing LLE Partial Weight Bearing Percentage or Pounds: 25%       Mobility Bed Mobility               General bed mobility comments: received and left sitting    Transfers Overall transfer level: Needs assistance Equipment used: Rolling walker (2 wheels) Transfers: Sit to/from Stand Sit to Stand: Min guard                 Balance Overall balance assessment: Needs assistance Sitting-balance support: No upper extremity  supported Sitting balance-Leahy Scale: Good     Standing balance support: No upper extremity supported, During functional activity Standing balance-Leahy Scale: Poor Standing balance comment: LOB when OT pulled up underwear                           ADL either performed or assessed with clinical judgement   ADL Overall ADL's : Needs assistance/impaired                                       General ADL Comments: MIN A + RW for BSC t/f, MIN A pericare in sitting.      Cognition Arousal/Alertness: Awake/alert Behavior During Therapy: Anxious Overall Cognitive Status: Within Functional Limits for tasks assessed                                          Exercises Exercises: Other exercises Other Exercises Other Exercises: Reviewed BLE exercises and IS (cues for technique)            Pertinent Vitals/ Pain       Pain Assessment Pain Assessment: 0-10 Pain Score: 2  Pain Location: L hip Pain Descriptors / Indicators: Discomfort, Dull, Grimacing Pain Intervention(s): Limited activity within patient's tolerance   Frequency  Min 2X/week        Progress Toward Goals  OT Goals(current goals can now be found in the  care plan section)  Progress towards OT goals: Progressing toward goals  Acute Rehab OT Goals Patient Stated Goal: to go home OT Goal Formulation: With patient/family Time For Goal Achievement: 05/19/22 Potential to Achieve Goals: Good ADL Goals Pt Will Perform Grooming: with set-up;with supervision;standing Pt Will Perform Lower Body Dressing: with min assist;sit to/from stand;with caregiver independent in assisting;with adaptive equipment Pt Will Transfer to Toilet: ambulating;bedside commode;with min guard assist  Plan Discharge plan remains appropriate;Frequency remains appropriate    Co-evaluation                 AM-PAC OT "6 Clicks" Daily Activity     Outcome Measure   Help from another person  eating meals?: None Help from another person taking care of personal grooming?: A Little Help from another person toileting, which includes using toliet, bedpan, or urinal?: A Little Help from another person bathing (including washing, rinsing, drying)?: A Lot Help from another person to put on and taking off regular upper body clothing?: A Little Help from another person to put on and taking off regular lower body clothing?: A Lot 6 Click Score: 17    End of Session Equipment Utilized During Treatment: Rolling walker (2 wheels)  OT Visit Diagnosis: Other abnormalities of gait and mobility (R26.89);Muscle weakness (generalized) (M62.81)   Activity Tolerance Patient tolerated treatment well   Patient Left in chair;with call bell/phone within reach;with family/visitor present   Nurse Communication Mobility status        Time: 7672-0947 OT Time Calculation (min): 27 min  Charges: OT General Charges $OT Visit: 1 Visit OT Treatments $Self Care/Home Management : 23-37 mins  Kathie Dike, M.S. OTR/L  05/07/22, 12:36 PM  ascom [phone]

## 2022-05-07 NOTE — Progress Notes (Signed)
Physical Therapy Treatment Patient Details Name: Krystal Sanchez MRN: [id number] DOB: 1941/07/03 Today's Date: 05/07/2022   History of Present Illness 81 y/o female s/p fall with L hip fracture; underwent hemiarthoplasty 7/17.    PT Comments    Pt continues to have general anxiousness but tolerated exercises, transitions and ambulate better today than any prior session.  She still remains very functionally limited but was able to walk ~25 ft (while maintaining 25% WBing on L).  Pt still having considerable pain but much less limited by this today with better AROM and quality of movement during exercises and ambulation.  Recommendations for follow up therapy are one component of a multi-disciplinary discharge planning process, led by the attending physician.  Recommendations may be updated based on patient status, additional functional criteria and insurance authorization.  Follow Up Recommendations  Skilled nursing-short term rehab (<3 hours/day) Can patient physically be transported by private vehicle: No   Assistance Recommended at Discharge Frequent or constant Supervision/Assistance  Patient can return home with the following A lot of help with walking and/or transfers;A little help with bathing/dressing/bathroom;Assistance with cooking/housework;Assist for transportation;Help with stairs or ramp for entrance   Equipment Recommendations  Rolling walker (2 wheels)    Recommendations for Other Services       Precautions / Restrictions Precautions Precautions: Fall;Posterior Hip Precaution Booklet Issued: Yes (comment) Restrictions Weight Bearing Restrictions: Yes LLE Weight Bearing: Partial weight bearing LLE Partial Weight Bearing Percentage or Pounds: 25%     Mobility  Bed Mobility Overal bed mobility: Needs Assistance Bed Mobility: Sit to Supine, Supine to Sit     Supine to sit: Min assist, Mod assist     General bed mobility comments: Pt was able to do some of the  work getting L (and R) LE toward EOB, needed assist with scooting hips toward EOB but with use of rail did relatively well getting trunk uprigt and to sitting EOB w/o a lot of assist    Transfers Overall transfer level: Needs assistance Equipment used: Rolling walker (2 wheels) Transfers: Sit to/from Stand Sit to Stand: Min guard, Min assist, From elevated surface           General transfer comment: With plenty of cuing and assist with set up (also elevated bed 2-3") pt was able to rise with only tactile cuing guidance to keep weight mostly to the R and really activate forward/upward shift    Ambulation/Gait Ambulation/Gait assistance: Min assist Gait Distance (Feet): 25 Feet Assistive device: Rolling walker (2 wheels)         General Gait Details: Slow, step-to gait, better confidence, UE use and overall effort this date. Pt's O2 remains in the high 90s, HR up to ~110 with the effort but certainly her best walk yet since sx   Stairs             Wheelchair Mobility    Modified Rankin (Stroke Patients Only)       Balance Overall balance assessment: Needs assistance Sitting-balance support: No upper extremity supported Sitting balance-Leahy Scale: Good Sitting balance - Comments: Pt leaning away from L hip but able to maintain sitting relatively confidently     Standing balance-Leahy Scale: Fair Standing balance comment: improved stability and confidence with static and dynamic balance this date                            Cognition Arousal/Alertness: Awake/alert Behavior During Therapy: Anxious Overall Cognitive Status:  Within Functional Limits for tasks assessed                                          Exercises Total Joint Exercises Ankle Circles/Pumps: AROM, 10 reps Quad Sets: Strengthening, 10 reps Short Arc Quad: Strengthening, AROM, 10 reps Heel Slides: AROM, AAROM, 10 reps Hip ABduction/ADduction: AAROM, 10 reps     General Comments        Pertinent Vitals/Pain Pain Assessment Pain Assessment: 0-10 Pain Score: 3  Pain Location: per her usual minimal pain at rest that increases with any movement of L LE    Home Living                          Prior Function            PT Goals (current goals can now be found in the care plan section) Progress towards PT goals: Progressing toward goals    Frequency    BID      PT Plan Current plan remains appropriate    Co-evaluation              AM-PAC PT "6 Clicks" Mobility   Outcome Measure  Help needed turning from your back to your side while in a flat bed without using bedrails?: A Little Help needed moving from lying on your back to sitting on the side of a flat bed without using bedrails?: A Lot Help needed moving to and from a bed to a chair (including a wheelchair)?: A Lot Help needed standing up from a chair using your arms (e.g., wheelchair or bedside chair)?: A Lot Help needed to walk in hospital room?: A Lot Help needed climbing 3-5 steps with a railing? : Total 6 Click Score: 12    End of Session Equipment Utilized During Treatment: Gait belt Activity Tolerance: Patient limited by fatigue;Patient limited by pain Patient left: in chair;with call bell/phone within reach;with family/visitor present;with nursing/sitter in room Nurse Communication: Mobility status;Weight bearing status PT Visit Diagnosis: Muscle weakness (generalized) (M62.81);Difficulty in walking, not elsewhere classified (R26.2);Pain Pain - Right/Left: Left Pain - part of body: Hip     Time: 8527-7824 PT Time Calculation (min) (ACUTE ONLY): 33 min  Charges:  $Therapeutic Exercise: 8-22 mins $Therapeutic Activity: 8-22 mins                     Malachi Pro, DPT 05/07/2022, 10:39 AM

## 2022-05-07 NOTE — Progress Notes (Signed)
Subjective: 3 Days Post-Op Procedure(s) (LRB): ARTHROPLASTY BIPOLAR HIP (HEMIARTHROPLASTY) (Left) Patient was seen on Wednesday, 05/06/2022.  She was doing well.  She is out of bed in a chair.  Her family was present.  She has started PT.  Hemoglobin is stable.  Patient reports pain as mild.  Objective:   VITALS:   Vitals:   05/07/22 0328 05/07/22 0900  BP: (!) 114/52 123/63  Pulse: 89 88  Resp: 18 18  Temp: 98.1 F (36.7 C) 98 F (36.7 C)  SpO2: 98% 99%    Neurologically intact Incision: dressing C/D/I  LABS Recent Labs    05/05/22 0423 05/06/22 0413 05/07/22 0543  HGB 12.4 11.9* 11.0*  HCT 38.2 36.8 33.5*  WBC 11.6* 10.0 8.0  PLT 188 173 193    Recent Labs    05/05/22 0423 05/06/22 0413 05/07/22 0543  NA 136 137 139  K 4.2 4.0 3.9  BUN 16 19 19   CREATININE 0.87 0.91 0.79  GLUCOSE 151* 124* 103*    No results for input(s): "LABPT", "INR" in the last 72 hours.   Assessment/Plan: 3 Days Post-Op Procedure(s) (LRB): ARTHROPLASTY BIPOLAR HIP (HEMIARTHROPLASTY) (Left)   Advance diet Up with therapy Discharge to SNF Discharge on 81 mg aspirin twice daily Return to clinic 2 weeks for exam and x-ray

## 2022-11-11 ENCOUNTER — Other Ambulatory Visit
Admission: RE | Admit: 2022-11-11 | Discharge: 2022-11-11 | Disposition: A | Discharge status: Home / Self Care | Payer: Medicare Other | Source: Ambulatory Visit | Attending: Internal Medicine | Admitting: Internal Medicine

## 2022-11-11 DIAGNOSIS — R600 Localized edema: Secondary | ICD-10-CM | POA: Insufficient documentation

## 2022-11-11 LAB — D-DIMER, QUANTITATIVE: D-Dimer, Quant: 0.76 ug/mL-FEU — ABNORMAL HIGH (ref 0.00–0.50)

## 2022-11-12 ENCOUNTER — Other Ambulatory Visit: Payer: Self-pay | Admitting: Internal Medicine

## 2022-11-12 ENCOUNTER — Encounter: Payer: Self-pay | Admitting: Internal Medicine

## 2022-11-12 ENCOUNTER — Ambulatory Visit
Admission: RE | Admit: 2022-11-12 | Discharge: 2022-11-12 | Disposition: A | Discharge status: Home / Self Care | Payer: Medicare Other | Source: Ambulatory Visit | Attending: Internal Medicine | Admitting: Internal Medicine

## 2022-11-12 DIAGNOSIS — R7989 Other specified abnormal findings of blood chemistry: Secondary | ICD-10-CM

## 2022-11-12 DIAGNOSIS — R600 Localized edema: Secondary | ICD-10-CM | POA: Diagnosis present

## 2023-06-03 ENCOUNTER — Ambulatory Visit
Admission: RE | Admit: 2023-06-03 | Discharge: 2023-06-03 | Disposition: A | Discharge status: Home / Self Care | Payer: Medicare Other | Source: Ambulatory Visit | Attending: Internal Medicine | Admitting: Internal Medicine

## 2023-06-03 ENCOUNTER — Other Ambulatory Visit: Payer: Self-pay | Admitting: Internal Medicine

## 2023-06-03 DIAGNOSIS — R600 Localized edema: Secondary | ICD-10-CM | POA: Diagnosis present

## 2023-06-14 ENCOUNTER — Other Ambulatory Visit: Payer: Self-pay | Admitting: Internal Medicine

## 2023-06-14 DIAGNOSIS — R188 Other ascites: Secondary | ICD-10-CM

## 2023-06-14 DIAGNOSIS — M25552 Pain in left hip: Secondary | ICD-10-CM

## 2023-06-14 DIAGNOSIS — R9389 Abnormal findings on diagnostic imaging of other specified body structures: Secondary | ICD-10-CM

## 2023-06-16 ENCOUNTER — Ambulatory Visit
Admission: RE | Admit: 2023-06-16 | Discharge: 2023-06-16 | Disposition: A | Discharge status: Home / Self Care | Payer: Medicare Other | Source: Ambulatory Visit | Attending: Internal Medicine | Admitting: Internal Medicine

## 2023-06-16 DIAGNOSIS — R9389 Abnormal findings on diagnostic imaging of other specified body structures: Secondary | ICD-10-CM | POA: Diagnosis present

## 2023-06-16 DIAGNOSIS — R188 Other ascites: Secondary | ICD-10-CM | POA: Insufficient documentation

## 2023-06-16 DIAGNOSIS — M25552 Pain in left hip: Secondary | ICD-10-CM | POA: Insufficient documentation

## 2023-06-16 LAB — POCT I-STAT CREATININE: Creatinine, Ser: 0.8 mg/dL (ref 0.44–1.00)

## 2023-06-16 MED ORDER — IOHEXOL 300 MG/ML  SOLN
100.0000 mL | Freq: Once | INTRAMUSCULAR | Status: AC | PRN
  Administered 2023-06-16: 100 mL via INTRAVENOUS

## 2024-04-06 ENCOUNTER — Encounter: Attending: Internal Medicine | Admitting: Internal Medicine

## 2024-04-06 DIAGNOSIS — I87302 Chronic venous hypertension (idiopathic) without complications of left lower extremity: Secondary | ICD-10-CM | POA: Insufficient documentation

## 2024-04-06 DIAGNOSIS — L97828 Non-pressure chronic ulcer of other part of left lower leg with other specified severity: Secondary | ICD-10-CM | POA: Diagnosis present

## 2024-04-06 DIAGNOSIS — S80812D Abrasion, left lower leg, subsequent encounter: Secondary | ICD-10-CM | POA: Insufficient documentation

## 2024-04-19 ENCOUNTER — Encounter: Attending: Physician Assistant | Admitting: Physician Assistant

## 2024-04-19 DIAGNOSIS — I87302 Chronic venous hypertension (idiopathic) without complications of left lower extremity: Secondary | ICD-10-CM | POA: Insufficient documentation

## 2024-04-19 DIAGNOSIS — S80812A Abrasion, left lower leg, initial encounter: Secondary | ICD-10-CM | POA: Diagnosis present

## 2024-04-19 DIAGNOSIS — W228XXA Striking against or struck by other objects, initial encounter: Secondary | ICD-10-CM | POA: Insufficient documentation

## 2024-04-19 DIAGNOSIS — Z09 Encounter for follow-up examination after completed treatment for conditions other than malignant neoplasm: Secondary | ICD-10-CM | POA: Insufficient documentation

## 2024-04-20 ENCOUNTER — Ambulatory Visit: Admitting: Physician Assistant

## 2024-05-03 ENCOUNTER — Encounter: Admitting: Physician Assistant

## 2024-05-03 DIAGNOSIS — Z09 Encounter for follow-up examination after completed treatment for conditions other than malignant neoplasm: Secondary | ICD-10-CM | POA: Diagnosis not present

## 2024-05-11 ENCOUNTER — Encounter
